# Patient Record
Sex: Female | Born: 1975 | Race: Black or African American | Hispanic: No | Marital: Married | Smoking: Never smoker
Health system: Southern US, Community
[De-identification: ages and names within clinical notes are randomized; demographics above are authoritative.]

## PROBLEM LIST (undated history)

## (undated) DIAGNOSIS — Z9889 Other specified postprocedural states: Secondary | ICD-10-CM

## (undated) DIAGNOSIS — R112 Nausea with vomiting, unspecified: Secondary | ICD-10-CM

## (undated) DIAGNOSIS — R0601 Orthopnea: Secondary | ICD-10-CM

## (undated) DIAGNOSIS — R0602 Shortness of breath: Secondary | ICD-10-CM

## (undated) DIAGNOSIS — J189 Pneumonia, unspecified organism: Secondary | ICD-10-CM

## (undated) HISTORY — DX: Shortness of breath: R06.02

## (undated) HISTORY — PX: FOOT SURGERY: SHX648

## (undated) HISTORY — DX: Orthopnea: R06.01

## (undated) HISTORY — DX: Pneumonia, unspecified organism: J18.9

---

## 2005-12-03 ENCOUNTER — Inpatient Hospital Stay (HOSPITAL_COMMUNITY): Admission: AD | Admit: 2005-12-03 | Discharge: 2005-12-03 | Payer: Self-pay | Admitting: Obstetrics and Gynecology

## 2005-12-04 ENCOUNTER — Inpatient Hospital Stay (HOSPITAL_COMMUNITY): Admission: AD | Admit: 2005-12-04 | Discharge: 2005-12-04 | Payer: Self-pay | Admitting: Obstetrics and Gynecology

## 2006-03-08 ENCOUNTER — Inpatient Hospital Stay (HOSPITAL_COMMUNITY): Admission: AD | Admit: 2006-03-08 | Discharge: 2006-03-11 | Payer: Self-pay | Admitting: Obstetrics and Gynecology

## 2006-03-09 ENCOUNTER — Encounter (INDEPENDENT_AMBULATORY_CARE_PROVIDER_SITE_OTHER): Payer: Self-pay | Admitting: *Deleted

## 2008-12-19 ENCOUNTER — Encounter: Payer: Self-pay | Admitting: Cardiovascular Disease

## 2009-03-21 ENCOUNTER — Inpatient Hospital Stay (HOSPITAL_COMMUNITY): Admission: AD | Admit: 2009-03-21 | Discharge: 2009-03-23 | Payer: Self-pay | Admitting: Obstetrics and Gynecology

## 2010-06-29 LAB — CBC
HCT: 35 % — ABNORMAL LOW (ref 36.0–46.0)
MCHC: 33.1 g/dL (ref 30.0–36.0)
MCHC: 33.8 g/dL (ref 30.0–36.0)
Platelets: 173 10*3/uL (ref 150–400)
Platelets: 192 10*3/uL (ref 150–400)
RBC: 3.56 MIL/uL — ABNORMAL LOW (ref 3.87–5.11)
RDW: 13.5 % (ref 11.5–15.5)

## 2010-08-14 NOTE — H&P (Signed)
NAME:  Janet Snyder, Janet Snyder NO.:  [id number]   MEDICAL RECORD NO.:  [id number]          PATIENT TYPE:  INP   LOCATION:  9162                          FACILITY:  [facility]   PHYSICIAN:  Osborn Coho, M.D.   DATE OF BIRTH:  Jul 25, 1975   DATE OF ADMISSION:  03/08/2006  DATE OF DISCHARGE:                              HISTORY & PHYSICAL   Janet Snyder is a 35 year old gravida 1, para 0, at 72 and 6/7 weeks, EDD  March 09, 2006, who presents in early active labor with contractions  increasing in frequency and intensity throughout the evening. She  reports positive fetal movement.  No vaginal bleeding.  No rupture of  membranes.  She denies any headache, visual changes or epigastric pain.  Her pregnancy has been followed by the MD service at [facility] and is  remarkable for:  1. History of abnormal Pap smear.  2. Family history of polydactyly.  3. First trimester bleeding.  4. Group B Strep negative.   This patient entered prenatal care at the office of [facility] on Aug 11, 2005, EDD determined by seven week ultrasound and confirmed with follow-  up.  This pregnancy has been essentially unremarkable.  At her prenatal  visit at 14 weeks, it was noted that she had an endocervical polyp.  This was removed by Dr. Stefano Gaul at [redacted] weeks gestation. At 26-1/2 weeks,  the patient had some preterm contractions.  Fetal fibronectin at that  time was negative.  She has remained size equal to dates throughout,  normotensive with no proteinuria. Initial pregnancy weight 160, last  recorded pregnancy weight 208 pounds.  Prenatal lab work on July 22, 2005, hemoglobin and hematocrit 11.6 and 34.5, platelets 205,000.  Blood  type and Rh B+, antibody screen negative.  Sickle cell trait negative,  VDRL nonreactive, rubella immune, hepatitis B surface antigen negative,  HIV nonreactive, GC and chlamydia negative.  CF testing negative.  Quad  screen declined. At 28 weeks, one hour glucose challenge 89 and  at 36  weeks, culture of the vaginal tract is negative for group B Strep.   OB HISTORY:  The current pregnancy.   MEDICAL HISTORY:  Significant for abnormal Pap, cervical polyp.  The  patient had foot surgery in 2003.   FAMILY HISTORY:  Maternal grandmother and maternal grandfather with a  history of chronic hypertension.  Maternal grandmother with varicose  veins. Maternal grandmother and paternal grandmother stroke.  Maternal  grandmother lupus. Paternal aunt cervical cancer.   GENETIC HISTORY:  The patient's father side of the family with a history  of polydactyly, otherwise, unremarkable.   ALLERGIES:  SULFA drugs.   HABITS:  The patient denies the use of tobacco, alcohol or illicit  drugs.   CURRENT MEDICATIONS:  Prenatal vitamins one p.o. daily   SOCIAL HISTORY:  Janet Snyder is a married African American female.  Her  husband, Janet Snyder, is involved and supportive.  They are [place]  in their faith.   REVIEW OF SYSTEMS:  As described above.  The patient is typical of one  with an intrauterine pregnancy  at term in early active labor.   PHYSICAL EXAMINATION:  VITAL SIGNS:  Stable, afebrile.  HEENT:  Unremarkable.  HEART:  Regular rate and rhythm.  LUNGS:  Clear.  ABDOMEN:  Gravid in its contour.  Uterine fundus is noted to extend 40  cm above the level of the pubic symphysis.  Leopold's maneuvers finds  the infant to be in a longitudinal lie, cephalic presentation, and the  estimated fetal weight is 7-1/2 pounds. Baseline of fetal heart rate  monitor is 140s with average long term variability, reactivity is  currently present with no periodic changes.  The patient is contracting  every 2-3 minutes.  EXTREMITIES:  No pathologic edema.  DTRs are 1+ with no clonus.  There  is no calf tenderness noted bilaterally.   ASSESSMENT:  Intrauterine pregnancy at term, early active labor.   PLAN:  Admit per Dr. Osborn Coho routine MD orders.  May have  epidural p.r.n.  for pain.  Anticipate spontaneous vaginal delivery.      Rica Koyanagi, C.N.M.      Osborn Coho, M.D.  Electronically Signed    SDM/MEDQ  D:  03/08/2006  T:  03/08/2006  Job:  [id number]

## 2010-08-19 ENCOUNTER — Inpatient Hospital Stay (HOSPITAL_COMMUNITY): Payer: Managed Care, Other (non HMO)

## 2010-08-19 ENCOUNTER — Inpatient Hospital Stay (HOSPITAL_COMMUNITY)
Admission: AD | Admit: 2010-08-19 | Discharge: 2010-08-23 | DRG: 781 | Disposition: A | Discharge status: Home / Self Care | Payer: Managed Care, Other (non HMO) | Source: Ambulatory Visit | Attending: Obstetrics and Gynecology | Admitting: Obstetrics and Gynecology

## 2010-08-19 ENCOUNTER — Inpatient Hospital Stay (HOSPITAL_COMMUNITY)
Admission: AD | Admit: 2010-08-19 | Payer: Managed Care, Other (non HMO) | Source: Ambulatory Visit | Admitting: Obstetrics and Gynecology

## 2010-08-19 DIAGNOSIS — O4700 False labor before 37 completed weeks of gestation, unspecified trimester: Secondary | ICD-10-CM | POA: Diagnosis present

## 2010-08-19 DIAGNOSIS — O26853 Spotting complicating pregnancy, third trimester: Secondary | ICD-10-CM

## 2010-08-19 DIAGNOSIS — D649 Anemia, unspecified: Secondary | ICD-10-CM | POA: Diagnosis present

## 2010-08-19 DIAGNOSIS — O4410 Placenta previa with hemorrhage, unspecified trimester: Principal | ICD-10-CM | POA: Diagnosis present

## 2010-08-19 DIAGNOSIS — O99019 Anemia complicating pregnancy, unspecified trimester: Secondary | ICD-10-CM | POA: Diagnosis present

## 2010-08-19 LAB — URINALYSIS, ROUTINE W REFLEX MICROSCOPIC
Glucose, UA: NEGATIVE mg/dL
Leukocytes, UA: NEGATIVE
Nitrite: NEGATIVE
Protein, ur: NEGATIVE mg/dL
Urobilinogen, UA: 0.2 mg/dL (ref 0.0–1.0)

## 2010-08-19 LAB — WET PREP, GENITAL: Yeast Wet Prep HPF POC: NONE SEEN

## 2010-08-19 LAB — URINE MICROSCOPIC-ADD ON

## 2010-08-20 LAB — CBC
HCT: 27.9 % — ABNORMAL LOW (ref 36.0–46.0)
Hemoglobin: 9 g/dL — ABNORMAL LOW (ref 12.0–15.0)
MCV: 87.5 fL (ref 78.0–100.0)
RBC: 3.19 MIL/uL — ABNORMAL LOW (ref 3.87–5.11)
WBC: 10.4 10*3/uL (ref 4.0–10.5)

## 2010-08-20 LAB — GC/CHLAMYDIA PROBE AMP, GENITAL

## 2010-08-23 ENCOUNTER — Encounter (HOSPITAL_COMMUNITY): Payer: Self-pay | Admitting: *Deleted

## 2010-09-05 ENCOUNTER — Inpatient Hospital Stay (HOSPITAL_COMMUNITY)
Admission: AD | Admit: 2010-09-05 | Discharge: 2010-09-05 | Disposition: A | Discharge status: Home / Self Care | Payer: Managed Care, Other (non HMO) | Source: Ambulatory Visit | Attending: Obstetrics and Gynecology | Admitting: Obstetrics and Gynecology

## 2010-09-05 DIAGNOSIS — O4700 False labor before 37 completed weeks of gestation, unspecified trimester: Secondary | ICD-10-CM | POA: Insufficient documentation

## 2010-09-05 LAB — URINALYSIS, ROUTINE W REFLEX MICROSCOPIC
Glucose, UA: NEGATIVE mg/dL
Nitrite: NEGATIVE
Specific Gravity, Urine: <1.005 — ABNORMAL LOW (ref 1.005–1.030)
pH: 6 (ref 5.0–8.0)

## 2010-09-05 LAB — URINE MICROSCOPIC-ADD ON

## 2010-09-07 ENCOUNTER — Other Ambulatory Visit (HOSPITAL_COMMUNITY): Payer: Self-pay | Admitting: Obstetrics and Gynecology

## 2010-09-07 DIAGNOSIS — O09529 Supervision of elderly multigravida, unspecified trimester: Secondary | ICD-10-CM

## 2010-09-09 ENCOUNTER — Ambulatory Visit (HOSPITAL_COMMUNITY)
Admission: RE | Admit: 2010-09-09 | Discharge: 2010-09-09 | Disposition: A | Discharge status: Home / Self Care | Payer: Managed Care, Other (non HMO) | Source: Ambulatory Visit | Attending: Obstetrics and Gynecology | Admitting: Obstetrics and Gynecology

## 2010-09-09 ENCOUNTER — Other Ambulatory Visit (HOSPITAL_COMMUNITY): Payer: Self-pay | Admitting: Obstetrics and Gynecology

## 2010-09-09 DIAGNOSIS — O09529 Supervision of elderly multigravida, unspecified trimester: Secondary | ICD-10-CM

## 2010-09-09 DIAGNOSIS — Z3689 Encounter for other specified antenatal screening: Secondary | ICD-10-CM | POA: Insufficient documentation

## 2010-09-09 DIAGNOSIS — O4400 Placenta previa specified as without hemorrhage, unspecified trimester: Secondary | ICD-10-CM | POA: Insufficient documentation

## 2010-09-15 ENCOUNTER — Inpatient Hospital Stay (HOSPITAL_COMMUNITY)
Admission: AD | Admit: 2010-09-15 | Discharge: 2010-09-17 | DRG: 766 | Disposition: A | Discharge status: Home / Self Care | Payer: Managed Care, Other (non HMO) | Source: Ambulatory Visit | Attending: Obstetrics and Gynecology | Admitting: Obstetrics and Gynecology

## 2010-09-15 ENCOUNTER — Other Ambulatory Visit (HOSPITAL_COMMUNITY): Payer: Managed Care, Other (non HMO)

## 2010-09-15 ENCOUNTER — Other Ambulatory Visit: Payer: Self-pay | Admitting: Obstetrics and Gynecology

## 2010-09-15 DIAGNOSIS — O4290 Premature rupture of membranes, unspecified as to length of time between rupture and onset of labor, unspecified weeks of gestation: Secondary | ICD-10-CM | POA: Diagnosis present

## 2010-09-15 DIAGNOSIS — O4410 Placenta previa with hemorrhage, unspecified trimester: Principal | ICD-10-CM | POA: Diagnosis present

## 2010-09-15 DIAGNOSIS — D4959 Neoplasm of unspecified behavior of other genitourinary organ: Secondary | ICD-10-CM | POA: Diagnosis present

## 2010-09-15 DIAGNOSIS — O34599 Maternal care for other abnormalities of gravid uterus, unspecified trimester: Secondary | ICD-10-CM | POA: Diagnosis present

## 2010-09-15 DIAGNOSIS — D259 Leiomyoma of uterus, unspecified: Secondary | ICD-10-CM | POA: Diagnosis present

## 2010-09-15 LAB — TYPE AND SCREEN: Antibody Screen: NEGATIVE

## 2010-09-15 LAB — CBC
MCHC: 32 g/dL (ref 30.0–36.0)
Platelets: 183 10*3/uL (ref 150–400)
RDW: 13.8 % (ref 11.5–15.5)
WBC: 9.8 10*3/uL (ref 4.0–10.5)

## 2010-09-16 LAB — CBC
HCT: 27 % — ABNORMAL LOW (ref 36.0–46.0)
MCH: 28.2 pg (ref 26.0–34.0)
MCV: 86.5 fL (ref 78.0–100.0)
Platelets: 163 10*3/uL (ref 150–400)
RBC: 3.12 MIL/uL — ABNORMAL LOW (ref 3.87–5.11)
RDW: 14 % (ref 11.5–15.5)
WBC: 9.3 10*3/uL (ref 4.0–10.5)

## 2010-09-22 ENCOUNTER — Inpatient Hospital Stay (HOSPITAL_COMMUNITY)
Admission: EM | Admit: 2010-09-22 | Payer: Managed Care, Other (non HMO) | Source: Ambulatory Visit | Admitting: Obstetrics and Gynecology

## 2010-09-22 NOTE — Op Note (Signed)
NAMEMarland Kitchen  Janet, Snyder NO.:  [id number]  MEDICAL RECORD NO.:  [id number]  LOCATION:  9109                          FACILITY:  [facility]  PHYSICIAN:  Janine Limbo, M.D.DATE OF BIRTH:  10/06/1975  DATE OF PROCEDURE:  09/15/2010 DATE OF DISCHARGE:                              OPERATIVE REPORT   PREOPERATIVE DIAGNOSES: 1. 36-week and 5-day gestation. 2. Placenta previa. 3. Premature and preterm rupture of membranes. 4. Fibroid uterus.  POSTOPERATIVE DIAGNOSES: 1. 36-week and 5-day gestation. 2. Placenta previa. 3. Premature and preterm rupture of membranes. 4. Fibroid uterus. 5. Two nuchal cords.  PROCEDURE:  Primary low-transverse cesarean section.  SURGEON:  Janine Limbo, MD  FIRST ASSISTANT:  Renaldo Reel. Emilee Hero, certified nurse midwife.  ANESTHETIC:  Spinal.  DISPOSITION:  Janet Snyder is a 35 year old female, gravida 3, para 2-0-0- 2, who presents at 36 weeks and 5-day gestation.  She has been followed at the [facility].  She reports rupture of membranes this morning. She has a known placenta previa and is scheduled for a cesarean delivery.  She denies bleeding at this point.  She is having only mild contractions.  The fetal heart rate tracing is listed as a category one. The patient understands the indications for her immediate cesarean delivery and she accepts the risks of, but not limited to, anesthetic complications, bleeding, infections, and possible damage to the surrounding organs.  The patient does not desire a tubal sterilization procedure.  FINDINGS:  An 8-pound female infant (name currently not known) was delivered from a cephalic presentation.  The Apgar scores were 8 at one minute and 9 at 5 minutes.  The patient was noted to have a 7-cm fibroid on the left anterior fundus of the uterus.  She was noted to have a placenta previa.  The fallopian tubes and the  ovaries were normal for the gravid state.  PROCEDURE:  The patient was taken to the operating room where a spinal anesthetic was given.  The patient's abdomen was prepped with ChloraPrep and the perineum and vagina were prepped with Betadine.  Foley catheter was placed in the bladder.  The patient was sterilely draped.  The lower abdomen was injected with 10 mL of 0.5% Marcaine with epinephrine.  A low-transverse incision was made in the abdomen and the incision was carried sharply through the subcutaneous tissue, the fascia, and the anterior peritoneum.  An incision was made in the lower uterine segment and extended in a low-transverse fashion.  The fetal head was delivered without difficulty.  The mouth and nose were suctioned.  Two nuchal cords were reduced.  The remainder of the infant was then delivered. The cord was clamped and cut and the infant was handed to the awaiting pediatric team.  Routine cord blood studies were obtained.  The placenta was removed without difficulty.  Hemostasis was adequate.  The uterine incision was closed using a running locking suture of 2-0 Vicryl followed by an imbricating suture of 2-0 Vicryl.  Figure-of-eight sutures of 2-0 Vicryl were used for hemostasis.  The pelvis was vigorously irrigated.  Again hemostasis was confirmed.  The anterior  peritoneum and abdominal musculature were reapproximated in the midline using 3-0 Vicryl.  The fascia was closed using a running suture of 0- Vicryl followed by three interrupted sutures of 0-Vicryl.  The subcutaneous layer was closed using interrupted sutures of 2-0 Vicryl. The skin was reapproximated using a subcuticular suture of 3-0 Monocryl. Sponge, needle, and instrument counts were correct on 2 occasions.  The estimated blood loss for the procedure was 850 mL.  The patient tolerated her procedure well.  She was transported to the recovery room in stable condition.  The infant was taken to the full-term  nursery in stable condition.  The placenta was sent to pathology for evaluation.     Janine Limbo, M.D.     AVS/MEDQ  D:  09/15/2010  T:  09/16/2010  Job:  [id number]  Electronically Signed by Kirkland Hun M.D. on 09/22/2010 07:26:18 AM

## 2010-10-16 NOTE — Discharge Summary (Signed)
NAMEMarland Kitchen  Janet Snyder, Janet Snyder NO.:  [id number]  MEDICAL RECORD NO.:  [id number]  LOCATION:  9109                          [facility]ILITY[facility]  [facility]  PHYSICIAN:  Osborn Coho, M.D.   DATE OF BIRTH:  1975-08-17  DATE OF ADMISSION:  09/15/2010 DATE OF DISCHARGE:  09/17/2010                              DISCHARGE SUMMARY   ADMITTING DIAGNOSES: 1. Intrauterine pregnancy at 36-5/7 weeks. 2. Spontaneous rupture of membranes. 3. Previa. 4. Irregular contractions.  DISCHARGE DIAGNOSES: 1. Intrauterine pregnancy at 36-5/7 weeks. 2. Placenta previa. 3. Premature and preterm rupture of membranes. 4. Fibroid uterus. 5. Two nuchal cords.  PROCEDURES: 1. Primary low transverse cesarean section. 2. Spinal anesthesia.  HOSPITAL COURSE:  Janet Snyder is a 35 year old, gravida 3, para 2-0-0-2, at 36-5/7 weeks who presented on the morning of September 15, 2010, with rupture of membranes at 6:30 a.m.  She had passed 1 clot prior to arrival.  She was leaking clear fluid, was having no active bleeding. She was noted to have a persistent previa that has been documented on third trimester ultrasound.  Fetal heart rate was reassuring.  Fluid was noted in the vaginal vault, with a positive fern, and the decision was made to proceed with cesarean section.  Her pregnancy had been remarkable for: 1. Persistent placenta previa. 2. Fibroid uterus on the left lateral side, approximately 5.6 cm on     last ultrasound. 3. 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  No recent or current     outbreak.  The patient was taken to the operating room by Dr. Stefano Gaul, who performed a primary low transverse cesarean section for a female infant, weight 8 pounds, Apgars were 8 and 9.  Infant was taken to the Full-Term Nursery.  Mother was taken to recovery in good condition.  The patient was noted to have a 7-cm fibroid on the left anterior fundus and the placenta previa was noted.  By postop day #1, the patient is  doing well.  She was up ad lib. Hemoglobin was 8.8, down from 10.3.  White blood cell count was 9.3. She was placed on iron.  Her physical exam was within normal limits. She was up ad lib.  She did report that her husband was now 2 months postop from vasectomy.  On the following morning, postop day #2, the patient was continuing to do well.  She advised she did want to go home. The baby was okayed for discharge.  The patient's incision was clean, dry, and intact.  The breastfeeding was going well.  She was deemed to have received the full benefit of her hospital stay and was discharged home in stable condition.  DISCHARGE INSTRUCTIONS:  Per [facility] handout.  DISCHARGE MEDICATIONS: 1. Motrin 600 mg p.o. q.6 h. p.r.n. pain. 2. Percocet 5/325 one to two p.o. q.3-4 h. p.r.n. pain. 3. The patient will take an over-the-counter iron supplement 1-2 p.o.     daily.  Discharge followup will occur in 6 weeks at [facility].     Renaldo Reel Janet Snyder, C.N.M.   ______________________________ Osborn Coho, M.D.    VLL/MEDQ  D:  09/17/2010  T:  09/18/2010  Job:  [id number]  Electronically Signed by Janet Snyder C.N.M. on 09/23/2010 08:10:00 PM Electronically Signed by Osborn Coho M.D. on 10/16/2010 05:32:35 PM

## 2010-10-19 NOTE — Discharge Summary (Signed)
NAMEMarland Kitchen  Janet Snyder, Janet Snyder NO.:  [id number]  MEDICAL RECORD NO.:  [id number]  LOCATION:  9159                          [facility]  [facility]  PHYSICIAN:  Naima A. Dillard, M.D. DATE OF BIRTH:  1975/05/01  DATE OF ADMISSION:  08/19/2010 DATE OF DISCHARGE:  08/23/2010                              DISCHARGE SUMMARY   REASON FOR ADMISSION:  Vaginal bleeding.  The patient is 32 weeks and 4 days.  She is a gravida 3, para 2.  Ultrasound showed a marginal placenta previa.  PRENATAL PROCEDURES:  None.  INTRAPARTUM PROCEDURES:  Ultrasound.  The patient was on bedrest.  She received betamethasone and ultrasound as well as fetal monitoring.  HOSPITAL COURSE:  The patient remained stable on bedrest.  Bleeding had stopped.  Fetal heart rate category I.  DISCHARGE DIAGNOSIS:  Third trimester bleeding, marginal placenta previa.  DISCHARGE INSTRUCTIONS:  For bedrest.  Call for bleeding, decreased fetal movement, contractions, or leakage of fluid.  MEDICATIONS: 1. Prenatal vitamin. 2. Iron 325 mg 1 tablet twice a day. 3. Stool softener 1-2 each day.  The patient was discharged home in stable condition with instructions to follow up per routine at [facility].    ______________________________ Sanda Klein, CNM   ______________________________ Pierre Bali. Normand Sloop, M.D.    SL/MEDQ  D:  10/18/2010  T:  10/19/2010  Job:  [id number]

## 2011-12-29 ENCOUNTER — Encounter: Payer: Self-pay | Admitting: Cardiovascular Disease

## 2012-01-18 ENCOUNTER — Encounter (HOSPITAL_BASED_OUTPATIENT_CLINIC_OR_DEPARTMENT_OTHER): Payer: Self-pay | Admitting: Emergency Medicine

## 2012-01-18 ENCOUNTER — Emergency Department (HOSPITAL_BASED_OUTPATIENT_CLINIC_OR_DEPARTMENT_OTHER)
Admission: EM | Admit: 2012-01-18 | Discharge: 2012-01-19 | Disposition: A | Discharge status: Home / Self Care | Payer: Managed Care, Other (non HMO) | Attending: Emergency Medicine | Admitting: Emergency Medicine

## 2012-01-18 DIAGNOSIS — R112 Nausea with vomiting, unspecified: Secondary | ICD-10-CM | POA: Insufficient documentation

## 2012-01-18 DIAGNOSIS — Z8701 Personal history of pneumonia (recurrent): Secondary | ICD-10-CM | POA: Insufficient documentation

## 2012-01-18 DIAGNOSIS — R197 Diarrhea, unspecified: Secondary | ICD-10-CM | POA: Insufficient documentation

## 2012-01-18 DIAGNOSIS — Z8709 Personal history of other diseases of the respiratory system: Secondary | ICD-10-CM | POA: Insufficient documentation

## 2012-01-18 DIAGNOSIS — Z79899 Other long term (current) drug therapy: Secondary | ICD-10-CM | POA: Insufficient documentation

## 2012-01-18 LAB — CBC WITH DIFFERENTIAL/PLATELET
Basophils Absolute: 0 10*3/uL (ref 0.0–0.1)
Eosinophils Relative: 0 % (ref 0–5)
HCT: 35.9 % — ABNORMAL LOW (ref 36.0–46.0)
Lymphocytes Relative: 11 % — ABNORMAL LOW (ref 12–46)
Lymphs Abs: 1.2 10*3/uL (ref 0.7–4.0)
MCV: 85.7 fL (ref 78.0–100.0)
Neutro Abs: 9.2 10*3/uL — ABNORMAL HIGH (ref 1.7–7.7)
Platelets: 146 10*3/uL — ABNORMAL LOW (ref 150–400)
RBC: 4.19 MIL/uL (ref 3.87–5.11)
RDW: 11.8 % (ref 11.5–15.5)
WBC: 10.7 10*3/uL — ABNORMAL HIGH (ref 4.0–10.5)

## 2012-01-18 LAB — URINALYSIS, ROUTINE W REFLEX MICROSCOPIC
Bilirubin Urine: NEGATIVE
Ketones, ur: NEGATIVE mg/dL
Nitrite: NEGATIVE
Protein, ur: NEGATIVE mg/dL
Specific Gravity, Urine: 1.009 (ref 1.005–1.030)
Urobilinogen, UA: 0.2 mg/dL (ref 0.0–1.0)

## 2012-01-18 LAB — PREGNANCY, URINE: Preg Test, Ur: NEGATIVE

## 2012-01-18 LAB — COMPREHENSIVE METABOLIC PANEL
ALT: 9 U/L (ref 0–35)
AST: 15 U/L (ref 0–37)
Alkaline Phosphatase: 52 U/L (ref 39–117)
CO2: 25 mEq/L (ref 19–32)
Chloride: 100 mEq/L (ref 96–112)
GFR calc Af Amer: >90 mL/min (ref >90)
GFR calc non Af Amer: >90 mL/min (ref >90)
Glucose, Bld: 120 mg/dL — ABNORMAL HIGH (ref 70–99)
Potassium: 3.5 mEq/L (ref 3.5–5.1)
Sodium: 136 mEq/L (ref 135–145)
Total Bilirubin: 0.3 mg/dL (ref 0.3–1.2)

## 2012-01-18 MED ORDER — ONDANSETRON HCL 4 MG/2ML IJ SOLN
4.0000 mg | Freq: Once | INTRAMUSCULAR | Status: AC
Start: 2012-01-19 — End: 2012-01-18
  Administered 2012-01-18: 4 mg via INTRAVENOUS

## 2012-01-18 MED ORDER — ONDANSETRON HCL 4 MG/2ML IJ SOLN
INTRAMUSCULAR | Status: AC
  Administered 2012-01-18: 4 mg via INTRAVENOUS
  Filled 2012-01-18: qty 2

## 2012-01-18 MED ORDER — SODIUM CHLORIDE 0.9 % IV BOLUS (SEPSIS)
1000.0000 mL | Freq: Once | INTRAVENOUS | Status: AC
  Administered 2012-01-18: 1000 mL via INTRAVENOUS

## 2012-01-18 NOTE — ED Notes (Signed)
MD at bedside. 

## 2012-01-18 NOTE — ED Notes (Signed)
Pt reports developing nausea/vomiting and diarhea and had bloody stool around 2000. Pt reports being in bathroom all day and does report history of hemrrhoids

## 2012-01-19 NOTE — ED Provider Notes (Signed)
History     CSN: [id number]  Arrival date & time 01/18/12  2151   First MD Initiated Contact with Patient 01/18/12 2322      Chief Complaint  Patient presents with  . Chills  . Diarrhea    (Consider location/radiation/quality/duration/timing/severity/associated sxs/prior treatment) Patient is a 36 y.o. female presenting with diarrhea. The history is provided by the patient. No language interpreter was used.  Diarrhea The primary symptoms include nausea and diarrhea. Primary symptoms do not include fever, weight loss, melena, jaundice, dysuria or myalgias. The illness began today. The onset was sudden. The problem has not changed since onset. Nausea began today. Associated with: nothing.  The diarrhea began today. The diarrhea is watery (scant blood on toilet paper has external hemorrohids). The diarrhea occurs 5 to 10 times per day. Risk factors: unknown.  The illness does not include chills, anorexia, dysphagia, constipation or tenesmus. Associated medical issues do not include inflammatory bowel disease.    Past Medical History  Diagnosis Date  . SOB (shortness of breath)   . PNA (pneumonia)   . Orthopnea     Past Surgical History  Procedure Date  . Foot surgery     Family History  Problem Relation Age of Onset  . Hypertension Father   . Heart failure Father     cva  . Heart attack Father     History  Substance Use Topics  . Smoking status: Never Smoker   . Smokeless tobacco: Not on file  . Alcohol Use: No    OB History    Grav Para Term Preterm Abortions TAB SAB Ect Mult Living   1               Review of Systems  Constitutional: Negative for fever, chills and weight loss.  Respiratory: Negative for shortness of breath.   Cardiovascular: Negative for chest pain.  Gastrointestinal: Positive for nausea and diarrhea. Negative for dysphagia, constipation, melena, anorexia and jaundice.  Genitourinary: Negative for dysuria and vaginal discharge.    Musculoskeletal: Negative for myalgias.  All other systems reviewed and are negative.    Allergies  Sulfonamide derivatives  Home Medications   Current Outpatient Rx  Name Route Sig Dispense Refill  . PRENATAL MULTIVITAMIN CH Oral Take 1 tablet by mouth daily.      BP 102/68  Pulse 70  Temp 98.3 F (36.8 C) (Oral)  Resp 16  Ht 5\' 7"  (1.702 m)  Wt 157 lb (71.215 kg)  BMI 24.59 kg/m2  SpO2 100%  LMP 01/17/2012  Breastfeeding? Unknown  Physical Exam  Constitutional: She is oriented to person, place, and time. She appears well-developed and well-nourished. No distress.  HENT:  Head: Normocephalic and atraumatic.  Mouth/Throat: Oropharynx is clear and moist.  Eyes: Conjunctivae normal are normal. Pupils are equal, round, and reactive to light.  Neck: Normal range of motion. Neck supple.  Cardiovascular: Normal rate and regular rhythm.   Pulmonary/Chest: Effort normal and breath sounds normal. She has no wheezes. She has no rales.  Abdominal: Soft. Bowel sounds are normal. She exhibits no distension and no mass. There is no tenderness. There is no rebound and no guarding.  Genitourinary:       Non thrombosed hemorrhoid with scant blood on it  Musculoskeletal: Normal range of motion. She exhibits no edema.  Neurological: She is alert and oriented to person, place, and time.  Skin: Skin is warm and dry.  Psychiatric: She has a normal mood and affect.  ED Course  Procedures (including critical care time)  Labs Reviewed  CBC WITH DIFFERENTIAL - Abnormal; Notable for the following:    WBC 10.7 (*)     Hemoglobin 11.9 (*)     HCT 35.9 (*)     Platelets 146 (*)     Neutrophils Relative 86 (*)     Neutro Abs 9.2 (*)     Lymphocytes Relative 11 (*)     All other components within normal limits  COMPREHENSIVE METABOLIC PANEL - Abnormal; Notable for the following:    Glucose, Bld 120 (*)     All other components within normal limits  URINALYSIS, ROUTINE W REFLEX  MICROSCOPIC - Abnormal; Notable for the following:    Hgb urine dipstick SMALL (*)     All other components within normal limits  URINE MICROSCOPIC-ADD ON - Abnormal; Notable for the following:    Bacteria, UA MANY (*)     Crystals CA OXALATE CRYSTALS (*)     All other components within normal limits  PREGNANCY, URINE   No results found.   No diagnosis found.    MDM  Exam labs and vital signs reassuring.  No indication for imaging at this time. Return for fevers, abdominal pain, worsening bleeding or any concerns.  Patient and husband verbalize understanding and agree to follow up        Tameyah Koch Smitty Cords, MD 01/19/12 1610

## 2012-03-27 ENCOUNTER — Telehealth: Payer: Self-pay | Admitting: Obstetrics and Gynecology

## 2012-03-27 NOTE — Telephone Encounter (Signed)
Lm on vm tcb rgd msg 

## 2012-03-28 NOTE — Telephone Encounter (Signed)
Lm on vm tcb rgd msg 

## 2012-03-28 NOTE — Telephone Encounter (Signed)
Spoke with pt rgd msg pt c/o irreg bleeding wants eval pt has appt 04/07/12 at 10:00 with nd pt voice undertanding

## 2012-04-07 ENCOUNTER — Encounter: Payer: Managed Care, Other (non HMO) | Admitting: Obstetrics and Gynecology

## 2012-05-05 ENCOUNTER — Ambulatory Visit: Payer: Managed Care, Other (non HMO) | Admitting: Obstetrics and Gynecology

## 2012-05-05 ENCOUNTER — Encounter: Payer: Self-pay | Admitting: Obstetrics and Gynecology

## 2012-05-05 VITALS — BP 110/70 | Ht 67.0 in | Wt 161.0 lb

## 2012-05-05 DIAGNOSIS — N644 Mastodynia: Secondary | ICD-10-CM

## 2012-05-05 DIAGNOSIS — N926 Irregular menstruation, unspecified: Secondary | ICD-10-CM

## 2012-05-05 DIAGNOSIS — B9689 Other specified bacterial agents as the cause of diseases classified elsewhere: Secondary | ICD-10-CM

## 2012-05-05 LAB — POCT URINE PREGNANCY: Preg Test, Ur: NEGATIVE

## 2012-05-05 NOTE — Progress Notes (Signed)
When did bleeding start: 04/28/12 How  Long: 1 week How often changing pad/tampon: every 3 hrs Bleeding Disorders: no Cramping: yes Contraception: no Fibroids: yes Hormone Therapy: no New Medications: no Menopausal Symptoms: no Vag. Discharge: yes Abdominal Pain: yes Increased Stress: no BP 110/70  Ht 5\' 7"  (1.702 m)  Wt 161 lb (73.029 kg)  BMI 25.22 kg/m2  LMP 04/28/2012  Breastfeeding? No Pt with multiple concerns 1. Pt with irregular vaginal bleeding.  She has a hx of fibroids.  Periods are q month lastin 10 days 2.  Vaginal discharge with a fishy odor 3. Right breast pain.  No masses or nipple dischrge. No pertinant family hitory Physical Examination: General appearance - alert, well appearing, and in no distress Chest - clear to auscultation, no wheezes, rales or rhonchi, symmetric air entry Heart - normal rate and regular rhythm Breasts - breasts appear normal, no suspicious masses, no skin or nipple changes or axillary nodes Pelvic - normal external genitalia, vulva, vagina, cervix,  and adnexa, WET MOUNT done - results: clue cells, excessive bacteria Uterus irregular and 11 wk size Results for orders placed in visit on 05/05/12  POCT URINE PREGNANCY      Component Value Range   Preg Test, Ur Negative    US/SHG @[place]  To breast center for [place] and dx mammogram Flagyl for BV

## 2012-05-17 ENCOUNTER — Ambulatory Visit
Admission: RE | Admit: 2012-05-17 | Discharge: 2012-05-17 | Disposition: A | Discharge status: Home / Self Care | Payer: Managed Care, Other (non HMO) | Source: Ambulatory Visit | Attending: Obstetrics and Gynecology | Admitting: Obstetrics and Gynecology

## 2012-05-17 DIAGNOSIS — N644 Mastodynia: Secondary | ICD-10-CM

## 2012-06-05 ENCOUNTER — Other Ambulatory Visit: Payer: Self-pay | Admitting: Obstetrics and Gynecology

## 2012-06-05 ENCOUNTER — Ambulatory Visit: Payer: Managed Care, Other (non HMO)

## 2012-06-05 ENCOUNTER — Ambulatory Visit: Payer: Managed Care, Other (non HMO) | Admitting: Obstetrics and Gynecology

## 2012-06-05 VITALS — BP 110/64 | Ht 67.0 in | Wt 161.0 lb

## 2012-06-05 DIAGNOSIS — N920 Excessive and frequent menstruation with regular cycle: Secondary | ICD-10-CM

## 2012-06-05 DIAGNOSIS — N926 Irregular menstruation, unspecified: Secondary | ICD-10-CM

## 2012-06-05 MED ORDER — METRONIDAZOLE 500 MG PO TABS: 500.0000 mg | ORAL_TABLET | Freq: Two times a day (BID) | ORAL | Status: DC

## 2012-06-05 NOTE — Progress Notes (Signed)
t here for [facility] and EMBX for irregular bleeding Uterus 6.86 cm by 6.07 cm 4.6 cm fibroid seen on left lateral uterine wall SHG/EMBX:  The patient was consented for both procedures.  She was placed in dorsal lithotomy position and speculum placed in the vagina.  The cervix was cleansed with three betadine swabs.  The endometrial pipet was placed in the the endometrial cavity through the cervix.  The uterus did sound to 8cm.  The pipet was removed and specimen was sent to pathology.  The sonohysterography catheter was then placed through the cervix and vaginal probe placed back in the vagina. No submucsal component was noted All treatments for irregular bleeding reviewed with the pt She will all with decision Flagyl rx printed off for BV Pt had [place] of breast.  US WNL

## 2012-06-07 LAB — PATHOLOGY

## 2012-06-13 ENCOUNTER — Emergency Department (HOSPITAL_BASED_OUTPATIENT_CLINIC_OR_DEPARTMENT_OTHER): Payer: Managed Care, Other (non HMO)

## 2012-06-13 ENCOUNTER — Observation Stay (HOSPITAL_COMMUNITY): Payer: Managed Care, Other (non HMO)

## 2012-06-13 ENCOUNTER — Encounter (HOSPITAL_COMMUNITY): Payer: Self-pay | Admitting: Anesthesiology

## 2012-06-13 ENCOUNTER — Encounter (HOSPITAL_COMMUNITY): Admission: EM | Disposition: A | Discharge status: Home / Self Care | Payer: Self-pay | Source: Home / Self Care

## 2012-06-13 ENCOUNTER — Observation Stay (HOSPITAL_COMMUNITY): Payer: Managed Care, Other (non HMO) | Admitting: Anesthesiology

## 2012-06-13 ENCOUNTER — Encounter (HOSPITAL_BASED_OUTPATIENT_CLINIC_OR_DEPARTMENT_OTHER): Payer: Self-pay | Admitting: *Deleted

## 2012-06-13 ENCOUNTER — Observation Stay (HOSPITAL_BASED_OUTPATIENT_CLINIC_OR_DEPARTMENT_OTHER)
Admission: EM | Admit: 2012-06-13 | Discharge: 2012-06-14 | Disposition: A | Discharge status: Home / Self Care | Payer: Managed Care, Other (non HMO) | Attending: General Surgery | Admitting: General Surgery

## 2012-06-13 DIAGNOSIS — K8010 Calculus of gallbladder with chronic cholecystitis without obstruction: Principal | ICD-10-CM | POA: Diagnosis present

## 2012-06-13 DIAGNOSIS — K8020 Calculus of gallbladder without cholecystitis without obstruction: Secondary | ICD-10-CM

## 2012-06-13 HISTORY — PX: CHOLECYSTECTOMY: SHX55

## 2012-06-13 LAB — SURGICAL PCR SCREEN: MRSA, PCR: NEGATIVE

## 2012-06-13 LAB — CBC WITH DIFFERENTIAL/PLATELET
Basophils Absolute: 0 10*3/uL (ref 0.0–0.1)
Basophils Relative: 0 % (ref 0–1)
Eosinophils Relative: 1 % (ref 0–5)
Lymphocytes Relative: 17 % (ref 12–46)
MCHC: 33.6 g/dL (ref 30.0–36.0)
MCV: 86 fL (ref 78.0–100.0)
Neutro Abs: 7.7 10*3/uL (ref 1.7–7.7)
Platelets: 134 10*3/uL — ABNORMAL LOW (ref 150–400)
RDW: 11.7 % (ref 11.5–15.5)
WBC: 9.7 10*3/uL (ref 4.0–10.5)

## 2012-06-13 LAB — URINALYSIS, ROUTINE W REFLEX MICROSCOPIC
Nitrite: NEGATIVE
Protein, ur: NEGATIVE mg/dL
Specific Gravity, Urine: 1.023 (ref 1.005–1.030)
Urobilinogen, UA: 0.2 mg/dL (ref 0.0–1.0)

## 2012-06-13 LAB — COMPREHENSIVE METABOLIC PANEL
ALT: 12 U/L (ref 0–35)
AST: 14 U/L (ref 0–37)
Albumin: 3.8 g/dL (ref 3.5–5.2)
CO2: 22 mEq/L (ref 19–32)
Calcium: 9.4 mg/dL (ref 8.4–10.5)
Creatinine, Ser: 0.8 mg/dL (ref 0.50–1.10)
GFR calc non Af Amer: >90 mL/min (ref >90)
Sodium: 137 mEq/L (ref 135–145)
Total Protein: 7.4 g/dL (ref 6.0–8.3)

## 2012-06-13 LAB — URINE MICROSCOPIC-ADD ON: RBC / HPF: NONE SEEN RBC/hpf (ref <3)

## 2012-06-13 LAB — PREGNANCY, URINE: Preg Test, Ur: NEGATIVE

## 2012-06-13 SURGERY — LAPAROSCOPIC CHOLECYSTECTOMY WITH INTRAOPERATIVE CHOLANGIOGRAM
Anesthesia: General | Wound class: Clean Contaminated

## 2012-06-13 MED ORDER — ACETAMINOPHEN 325 MG PO TABS: 650.0000 mg | ORAL_TABLET | ORAL | Status: DC | PRN

## 2012-06-13 MED ORDER — MIDAZOLAM HCL 5 MG/5ML IJ SOLN
INTRAMUSCULAR | Status: DC | PRN
  Administered 2012-06-13: 2 mg via INTRAVENOUS

## 2012-06-13 MED ORDER — ONDANSETRON HCL 4 MG/2ML IJ SOLN
INTRAMUSCULAR | Status: DC | PRN
  Administered 2012-06-13: 4 mg via INTRAVENOUS

## 2012-06-13 MED ORDER — LIDOCAINE HCL (CARDIAC) 20 MG/ML IV SOLN
INTRAVENOUS | Status: DC | PRN
  Administered 2012-06-13: 100 mg via INTRAVENOUS

## 2012-06-13 MED ORDER — KCL IN DEXTROSE-NACL 20-5-0.45 MEQ/L-%-% IV SOLN
INTRAVENOUS | Status: DC
  Administered 2012-06-13: 18:00:00 via INTRAVENOUS
  Filled 2012-06-13 (×2): qty 1000

## 2012-06-13 MED ORDER — SUCCINYLCHOLINE CHLORIDE 20 MG/ML IJ SOLN
INTRAMUSCULAR | Status: DC | PRN
  Administered 2012-06-13: 100 mg via INTRAVENOUS

## 2012-06-13 MED ORDER — SODIUM CHLORIDE 0.9 % IV BOLUS (SEPSIS)
1000.0000 mL | Freq: Once | INTRAVENOUS | Status: AC
  Administered 2012-06-13: 1000 mL via INTRAVENOUS

## 2012-06-13 MED ORDER — CIPROFLOXACIN IN D5W 400 MG/200ML IV SOLN
400.0000 mg | Freq: Two times a day (BID) | INTRAVENOUS | Status: DC
  Administered 2012-06-13: 400 mg via INTRAVENOUS
  Filled 2012-06-13: qty 200

## 2012-06-13 MED ORDER — HYDROMORPHONE HCL PF 1 MG/ML IJ SOLN: 0.5000 mg | INTRAMUSCULAR | Status: DC | PRN

## 2012-06-13 MED ORDER — FENTANYL CITRATE 0.05 MG/ML IJ SOLN
INTRAMUSCULAR | Status: DC | PRN
  Administered 2012-06-13: 50 ug via INTRAVENOUS

## 2012-06-13 MED ORDER — HYDROMORPHONE HCL PF 1 MG/ML IJ SOLN
1.0000 mg | Freq: Once | INTRAMUSCULAR | Status: AC
  Administered 2012-06-13: 1 mg via INTRAVENOUS
  Filled 2012-06-13: qty 1

## 2012-06-13 MED ORDER — FENTANYL CITRATE 0.05 MG/ML IJ SOLN: 25.0000 ug | INTRAMUSCULAR | Status: DC | PRN

## 2012-06-13 MED ORDER — DEXAMETHASONE SODIUM PHOSPHATE 10 MG/ML IJ SOLN
INTRAMUSCULAR | Status: DC | PRN
  Administered 2012-06-13: 10 mg via INTRAVENOUS

## 2012-06-13 MED ORDER — LACTATED RINGERS IV SOLN
INTRAVENOUS | Status: DC | PRN
  Administered 2012-06-13 (×2): via INTRAVENOUS

## 2012-06-13 MED ORDER — LACTATED RINGERS IV SOLN
INTRAVENOUS | Status: DC | PRN
  Administered 2012-06-13: 1000 mL

## 2012-06-13 MED ORDER — GLYCOPYRROLATE 0.2 MG/ML IJ SOLN
INTRAMUSCULAR | Status: DC | PRN
  Administered 2012-06-13: 0.6 mg via INTRAVENOUS

## 2012-06-13 MED ORDER — ACETAMINOPHEN 325 MG PO TABS: 650.0000 mg | ORAL_TABLET | Freq: Four times a day (QID) | ORAL | Status: DC | PRN

## 2012-06-13 MED ORDER — POTASSIUM CHLORIDE IN NACL 20-0.9 MEQ/L-% IV SOLN
INTRAVENOUS | Status: DC
  Filled 2012-06-13 (×2): qty 1000

## 2012-06-13 MED ORDER — HYDROMORPHONE HCL PF 1 MG/ML IJ SOLN
1.0000 mg | INTRAMUSCULAR | Status: DC | PRN
  Filled 2012-06-13: qty 1

## 2012-06-13 MED ORDER — 0.9 % SODIUM CHLORIDE (POUR BTL) OPTIME
TOPICAL | Status: DC | PRN
  Administered 2012-06-13: 1000 mL

## 2012-06-13 MED ORDER — PROPOFOL 10 MG/ML IV BOLUS
INTRAVENOUS | Status: DC | PRN
  Administered 2012-06-13: 160 mg via INTRAVENOUS

## 2012-06-13 MED ORDER — ONDANSETRON HCL 4 MG/2ML IJ SOLN: 4.0000 mg | Freq: Four times a day (QID) | INTRAMUSCULAR | Status: DC | PRN

## 2012-06-13 MED ORDER — PROMETHAZINE HCL 25 MG/ML IJ SOLN: 12.5000 mg | INTRAMUSCULAR | Status: DC | PRN

## 2012-06-13 MED ORDER — PANTOPRAZOLE SODIUM 40 MG IV SOLR
40.0000 mg | Freq: Once | INTRAVENOUS | Status: AC
  Administered 2012-06-13: 40 mg via INTRAVENOUS
  Filled 2012-06-13: qty 40

## 2012-06-13 MED ORDER — SODIUM CHLORIDE 0.9 % IV SOLN: INTRAVENOUS | Status: DC

## 2012-06-13 MED ORDER — IOHEXOL 300 MG/ML  SOLN
INTRAMUSCULAR | Status: DC | PRN
  Administered 2012-06-13: 10 mL

## 2012-06-13 MED ORDER — ACETAMINOPHEN 650 MG RE SUPP: 650.0000 mg | Freq: Four times a day (QID) | RECTAL | Status: DC | PRN

## 2012-06-13 MED ORDER — HYDROMORPHONE HCL PF 1 MG/ML IJ SOLN
INTRAMUSCULAR | Status: AC
  Administered 2012-06-13: 1 mg
  Filled 2012-06-13: qty 1

## 2012-06-13 MED ORDER — ONDANSETRON HCL 4 MG/2ML IJ SOLN
4.0000 mg | Freq: Once | INTRAMUSCULAR | Status: AC
  Administered 2012-06-13: 4 mg via INTRAVENOUS
  Filled 2012-06-13: qty 2

## 2012-06-13 MED ORDER — ROCURONIUM BROMIDE 100 MG/10ML IV SOLN
INTRAVENOUS | Status: DC | PRN
  Administered 2012-06-13: 25 mg via INTRAVENOUS

## 2012-06-13 MED ORDER — NEOSTIGMINE METHYLSULFATE 1 MG/ML IJ SOLN
INTRAMUSCULAR | Status: DC | PRN
  Administered 2012-06-13: 4 mg via INTRAVENOUS

## 2012-06-13 MED ORDER — HYDROCODONE-ACETAMINOPHEN 5-325 MG PO TABS
1.0000 | ORAL_TABLET | ORAL | Status: DC | PRN
  Administered 2012-06-14: 2 via ORAL
  Filled 2012-06-13: qty 2

## 2012-06-13 MED ORDER — BUPIVACAINE-EPINEPHRINE 0.5% -1:200000 IJ SOLN
INTRAMUSCULAR | Status: DC | PRN
  Administered 2012-06-13: 30 mL

## 2012-06-13 MED ORDER — LACTATED RINGERS IV SOLN: INTRAVENOUS | Status: DC

## 2012-06-13 MED ORDER — CHLORHEXIDINE GLUCONATE CLOTH 2 % EX PADS
6.0000 | MEDICATED_PAD | Freq: Every day | CUTANEOUS | Status: DC
  Administered 2012-06-13: 6 via TOPICAL

## 2012-06-13 SURGICAL SUPPLY — 42 items
APL SKNCLS STERI-STRIP NONHPOA (GAUZE/BANDAGES/DRESSINGS) ×1
APPLIER CLIP ROT 10 11.4 M/L (STAPLE) ×2
APR CLP MED LRG 11.4X10 (STAPLE) ×1
BAG SPEC RTRVL LRG 6X4 10 (ENDOMECHANICALS) ×1
BENZOIN TINCTURE PRP APPL 2/3 (GAUZE/BANDAGES/DRESSINGS) ×2 IMPLANT
CABLE HIGH FREQUENCY MONO STRZ (ELECTRODE) ×2 IMPLANT
CANISTER SUCTION 2500CC (MISCELLANEOUS) ×2 IMPLANT
CHLORAPREP W/TINT 26ML (MISCELLANEOUS) ×2 IMPLANT
CLIP APPLIE ROT 10 11.4 M/L (STAPLE) ×1 IMPLANT
CLOTH BEACON ORANGE TIMEOUT ST (SAFETY) ×2 IMPLANT
COVER MAYO STAND STRL (DRAPES) ×2 IMPLANT
DECANTER SPIKE VIAL GLASS SM (MISCELLANEOUS) ×2 IMPLANT
DRAPE C-ARM 42X72 X-RAY (DRAPES) ×2 IMPLANT
DRAPE LAPAROSCOPIC ABDOMINAL (DRAPES) ×2 IMPLANT
DRAPE UTILITY 15X26 (DRAPE) ×2 IMPLANT
ELECT REM PT RETURN 9FT ADLT (ELECTROSURGICAL) ×2
ELECTRODE REM PT RTRN 9FT ADLT (ELECTROSURGICAL) ×1 IMPLANT
GAUZE SPONGE 2X2 8PLY STRL LF (GAUZE/BANDAGES/DRESSINGS) IMPLANT
GLOVE BIOGEL PI IND STRL 7.0 (GLOVE) ×1 IMPLANT
GLOVE BIOGEL PI INDICATOR 7.0 (GLOVE) ×1
GLOVE SURG ORTHO 8.0 STRL STRW (GLOVE) ×2 IMPLANT
GOWN STRL NON-REIN LRG LVL3 (GOWN DISPOSABLE) ×2 IMPLANT
GOWN STRL REIN XL XLG (GOWN DISPOSABLE) ×4 IMPLANT
HEMOSTAT SURGICEL 4X8 (HEMOSTASIS) IMPLANT
KIT BASIN OR (CUSTOM PROCEDURE TRAY) ×2 IMPLANT
NS IRRIG 1000ML POUR BTL (IV SOLUTION) ×2 IMPLANT
POUCH SPECIMEN RETRIEVAL 10MM (ENDOMECHANICALS) ×2 IMPLANT
SCISSORS LAP 5X35 DISP (ENDOMECHANICALS) ×1 IMPLANT
SET CHOLANGIOGRAPH MIX (MISCELLANEOUS) ×2 IMPLANT
SET IRRIG TUBING LAPAROSCOPIC (IRRIGATION / IRRIGATOR) ×2 IMPLANT
SLEEVE Z-THREAD 5X100MM (TROCAR) ×2 IMPLANT
SOLUTION ANTI FOG 6CC (MISCELLANEOUS) ×2 IMPLANT
SPONGE GAUZE 2X2 STER 10/PKG (GAUZE/BANDAGES/DRESSINGS) ×1
STRIP CLOSURE SKIN 1/2X4 (GAUZE/BANDAGES/DRESSINGS) ×2 IMPLANT
SUT MNCRL AB 4-0 PS2 18 (SUTURE) ×2 IMPLANT
TAPE CLOTH SURG 4X10 WHT LF (GAUZE/BANDAGES/DRESSINGS) ×1 IMPLANT
TOWEL OR 17X26 10 PK STRL BLUE (TOWEL DISPOSABLE) ×6 IMPLANT
TRAY LAP CHOLE (CUSTOM PROCEDURE TRAY) ×2 IMPLANT
TROCAR XCEL BLUNT TIP 100MML (ENDOMECHANICALS) ×2 IMPLANT
TROCAR Z-THREAD FIOS 11X100 BL (TROCAR) ×2 IMPLANT
TROCAR Z-THREAD FIOS 5X100MM (TROCAR) ×4 IMPLANT
TUBING INSUFFLATION 10FT LAP (TUBING) ×2 IMPLANT

## 2012-06-13 NOTE — Transfer of Care (Signed)
Immediate Anesthesia Transfer of Care Note  Patient: Janet Snyder  Procedure(s) Performed: Procedure(s): LAPAROSCOPIC CHOLECYSTECTOMY WITH INTRAOPERATIVE CHOLANGIOGRAM (N/A)  Patient Location: PACU  Anesthesia Type:General  Level of Consciousness: sedated  Airway & Oxygen Therapy: Patient Spontanous Breathing and Patient connected to face mask oxygen  Post-op Assessment: Report given to PACU RN and Post -op Vital signs reviewed and stable  Post vital signs: Reviewed and stable  Complications: No apparent anesthesia complications

## 2012-06-13 NOTE — H&P (Signed)
Janet Snyder is an 37 y.o. female.   Chief Complaint: Abdominal pain HPI: This is a normally healthy 37 year old woman who awoke with  abdominal pain this morning around 5 AM. She initially thought this was reflux symptoms, she treated it with Prilosec and Tums.  It did not help and she has never had this problem before.  She had her husband take her to the ER at [facility], where workup showed, essentially normal labs lipase of 25, normal WBC.  Abdominal ultrasound shows cholelithiasis with positive Murphy sign no gallbladder wall thickening pericholecystic fluid common bile duct was 4 mm there was no other abnormality on the exam. She was referred to [facility] and Dr.Gerkin for evaluation for symptomatic cholelithiasis/cholecystitis.  Past Medical History  Diagnosis Date  . SOB (shortness of breath)   . PNA (pneumonia)   . Orthopnea     Past Surgical History  Procedure Laterality Date  . Foot surgery      Family History  Problem Relation Age of Onset  . Hypertension Father   . Heart failure Father     cva  . Heart attack Father    Social History:  reports that she has never smoked. She has never used smokeless tobacco. She reports that she does not drink alcohol or use illicit drugs.  Allergies:  Allergies  Allergen Reactions  . Sulfonamide Derivatives     Medications Prior to Admission  Medication Sig Dispense Refill  . Multiple Vitamins-Minerals (MULTIVITAMIN WITH MINERALS) tablet Take 1 tablet by mouth daily.      . naproxen sodium (ANAPROX) 220 MG tablet Take 220 mg by mouth daily as needed.      . vitamin E 1000 UNIT capsule Take 1,000 Units by mouth daily.        Results for orders placed during the hospital encounter of 06/13/12 (from the past 48 hour(s))  URINALYSIS, ROUTINE W REFLEX MICROSCOPIC     Status: Abnormal   Collection Time    06/13/12  8:23 AM      Result Value Range   Color, Urine YELLOW  YELLOW   APPearance TURBID (*) CLEAR    Specific Gravity, Urine 1.023  1.005 - 1.030   pH 8.0  5.0 - 8.0   Glucose, UA NEGATIVE  NEGATIVE mg/dL   Hgb urine dipstick NEGATIVE  NEGATIVE   Bilirubin Urine NEGATIVE  NEGATIVE   Ketones, ur 15 (*) NEGATIVE mg/dL   Protein, ur NEGATIVE  NEGATIVE mg/dL   Urobilinogen, UA 0.2  0.0 - 1.0 mg/dL   Nitrite NEGATIVE  NEGATIVE   Leukocytes, UA NEGATIVE  NEGATIVE  PREGNANCY, URINE     Status: None   Collection Time    06/13/12  8:23 AM      Result Value Range   Preg Test, Ur NEGATIVE  NEGATIVE   Comment:            THE SENSITIVITY OF THIS     METHODOLOGY IS >20 mIU/mL.  URINE MICROSCOPIC-ADD ON     Status: Abnormal   Collection Time    06/13/12  8:23 AM      Result Value Range   Squamous Epithelial / LPF RARE  RARE   WBC, UA 0-2  <3 WBC/hpf   RBC / HPF    <3 RBC/hpf   Value: NO FORMED ELEMENTS SEEN ON URINE MICROSCOPIC EXAMINATION   Bacteria, UA FEW (*) RARE   Urine-Other AMORPHOUS URATES/PHOSPHATES    CBC WITH DIFFERENTIAL  Status: Abnormal   Collection Time    06/13/12  8:30 AM      Result Value Range   WBC 9.7  4.0 - 10.5 K/uL   RBC 4.22  3.87 - 5.11 MIL/uL   Hemoglobin 12.2  12.0 - 15.0 g/dL   HCT 96.0  45.4 - 09.8 %   MCV 86.0  78.0 - 100.0 fL   MCH 28.9  26.0 - 34.0 pg   MCHC 33.6  30.0 - 36.0 g/dL   RDW 11.9  14.7 - 82.9 %   Platelets 134 (*) 150 - 400 K/uL   Neutrophils Relative 79 (*) 43 - 77 %   Neutro Abs 7.7  1.7 - 7.7 K/uL   Lymphocytes Relative 17  12 - 46 %   Lymphs Abs 1.6  0.7 - 4.0 K/uL   Monocytes Relative 4  3 - 12 %   Monocytes Absolute 0.4  0.1 - 1.0 K/uL   Eosinophils Relative 1  0 - 5 %   Eosinophils Absolute 0.1  0.0 - 0.7 K/uL   Basophils Relative 0  0 - 1 %   Basophils Absolute 0.0  0.0 - 0.1 K/uL  COMPREHENSIVE METABOLIC PANEL     Status: Abnormal   Collection Time    06/13/12  8:30 AM      Result Value Range   Sodium 137  135 - 145 mEq/L   Potassium 3.4 (*) 3.5 - 5.1 mEq/L   Chloride 104  96 - 112 mEq/L   CO2 22  19 - 32 mEq/L    Glucose, Bld 111 (*) 70 - 99 mg/dL   BUN 13  6 - 23 mg/dL   Creatinine, Ser 5.62  0.50 - 1.10 mg/dL   Calcium 9.4  8.4 - 13.0 mg/dL   Total Protein 7.4  6.0 - 8.3 g/dL   Albumin 3.8  3.5 - 5.2 g/dL   AST 14  0 - 37 U/L   ALT 12  0 - 35 U/L   Alkaline Phosphatase 49  39 - 117 U/L   Total Bilirubin 0.3  0.3 - 1.2 mg/dL   GFR calc non Af Amer >90  >90 mL/min   GFR calc Af Amer >90  >90 mL/min   Comment:            The eGFR has been calculated     using the CKD EPI equation.     This calculation has not been     validated in all clinical     situations.     eGFR's persistently     <90 mL/min signify     possible Chronic Kidney Disease.  LIPASE, BLOOD     Status: None   Collection Time    06/13/12  8:30 AM      Result Value Range   Lipase 25  11 - 59 U/L   US Abdomen Complete  06/13/2012  *RADIOLOGY REPORT*  Clinical Data:  Right upper quadrant abdominal pain  COMPLETE ABDOMINAL ULTRASOUND  Comparison:  None.  Findings:  Gallbladder:  Multiple gallstones including a 1.5 cm stone in the gallbladder neck.  No gallbladder wall thickening or pericholecystic fluid.  Positive sonographic Murphy's sign.  Common bile duct:  Measures 4 mm.  Liver:  No focal lesion identified.  Within normal limits in parenchymal echogenicity.  IVC:  Poorly visualized due to overlying bowel gas.  Pancreas:  Poorly visualized due to overlying bowel gas.  Spleen:  Measures 7.6 cm.  Right Kidney:  Measures 11.7 cm.  No mass or hydronephrosis.  Left Kidney:  Measures 11.0 cm.  No mass or hydronephrosis.  Abdominal aorta:  No aneurysm identified.  IMPRESSION: Cholelithiasis with positive sonographic Murphy's sign.  No gallbladder wall thickening or pericholecystic fluid.  This appearance is equivocal but early acute cholecystitis is possible.  Consider hepatobiliary nuclear medicine scan for further evaluation.   Original Report Authenticated By: Charline Bills, M.D.     Review of Systems  Constitutional: Negative.    HENT: Negative.   Eyes: Negative.   Respiratory: Negative.   Cardiovascular: Negative.   Gastrointestinal: Positive for heartburn (occasional ), nausea and abdominal pain. Negative for vomiting, diarrhea, constipation, blood in stool and melena.  Genitourinary: Negative.   Musculoskeletal: Negative.   Skin: Negative.   Neurological: Negative.   Endo/Heme/Allergies: Negative.   Psychiatric/Behavioral: Negative.     Blood pressure 106/69, pulse 65, temperature 98.3 F (36.8 C), temperature source Oral, resp. rate 20, height 5\' 7"  (1.702 m), weight 163 lb 5.8 oz (74.1 kg), last menstrual period 05/26/2012, SpO2 100.00%. Physical Exam  Constitutional: She is oriented to person, place, and time. She appears well-developed and well-nourished. No distress.  HENT:  Head: Normocephalic and atraumatic.  Nose: Nose normal.  Eyes: Conjunctivae and EOM are normal. Pupils are equal, round, and reactive to light. Right eye exhibits no discharge. Left eye exhibits no discharge. No scleral icterus.  Neck: Normal range of motion. Neck supple. No JVD present. No tracheal deviation present. No thyromegaly present.  Cardiovascular: Normal rate, regular rhythm, normal heart sounds and intact distal pulses.  Exam reveals no gallop.   No murmur heard. Respiratory: Effort normal and breath sounds normal. No stridor. No respiratory distress. She has no wheezes. She has no rales. She exhibits no tenderness.  GI: She exhibits no distension and no mass. There is tenderness (pain in mid epigastric area and RUQ). There is no rebound and no guarding.  Musculoskeletal: She exhibits no edema and no tenderness.  Lymphadenopathy:    She has no cervical adenopathy.  Neurological: She is alert and oriented to person, place, and time. No cranial nerve deficit.  Skin: Skin is warm and dry. No rash noted. She is not diaphoretic. No erythema. No pallor.  Psychiatric: She has a normal mood and affect. Her behavior is normal.  Judgment and thought content normal.     Assessment/Plan  1. Acute cholecystitis/cholelithiasis  Plan:  Hydrate, IV antibiotics, and probable surgery later today  [facility] physician assistant for Dr. Darnell Level   Marsia Cino 06/13/2012, 12:45 PM

## 2012-06-13 NOTE — Op Note (Signed)
Procedure Note  Pre-operative Diagnosis: Calculus of gallbladder with other cholecystitis, without mention of obstruction  Post-operative Diagnosis: Same  Surgeon:  Velora Heckler, MD, FACS  Procedure:  Laparoscopic cholecystectomy with intra-operative cholangiography  Assistant:  None    Anesthesia:  General  Indications: This patient presents with symptomatic gallbladder disease and will undergo laparoscopic cholecystectomy with intraoperative cholangiography.  Procedure Details: The patient was seen in the pre-op holding area. The risks, benefits, complications, treatment options, and expected outcomes have been discussed with the patient. The patient and/or family agreed with the proposed plan and signed the informed consent form.  The patient was taken to Operating Room, identified as Janet Snyder and the procedure verified as Laparoscopic Cholecystectomy with Intraoperative Cholangiogram. A "time out" was completed and the above information confirmed.  Prior to the induction of general anesthesia, antibiotic prophylaxis was administered. General endotracheal anesthesia was then administered and tolerated well. After the induction, the abdomen was prepped in the usual strict aseptic fashion. The patient was in the supine position.  An incision was made in the skin near the umbilicus. The midline fascia was incised and the peritoneal cavity entered and the Hasson canula was introduced under direct vision. Hasson canula was secured with a pursestring 0-Vicryl suture. Pneumoperitoneum was then established with carbon dioxide and tolerated well without any adverse changes in the patient's vital signs. Additional trocars were introduced under direct vision along the right costal margin in the midline, mid-clavicular line, and anterior axillary line.  The gallbladder was identified and the fundus grasped and retracted cephalad. Adhesions were taken down bluntly and with the electrocautery  as needed, taking care not to injure any adjacent structures. The infundibulum was grasped and retracted laterally, exposing the peritoneum overlying the triangle of Calot. This was incised and structures exposed in a blunt fashion. The cystic duct was clearly identified and bluntly dissected circumferentially and clipped at the neck of the gallbladder.  An incision was made in the cystic duct and the cholangiogram catheter introduced. The catheter was secured using an ligaclip.  Real-time cholangiography was performed using the C-arm.  There was rapid filling of a normal caliber common bile duct.  There was reflux of contrast into the left and right hepatic ductal systems.  There was free flow distally into the duodenum without filling defect or obstruction.  Catheter was removed from the peritoneal cavity.  The cystic duct was then triply ligated with surgical clips and divided. The cystic artery was identified, dissected circumferentially, ligated with ligaclips, and divided.   The gallbladder was dissected from the liver bed with the electrocautery used for hemostasis. The gallbladder was completely removed and placed into an endocatch bag. The right upper quadrant was irrigated and inspected. Hemostasis was achieved with the electrocautery. Warm saline irrigation was utilized and was repeatedly aspirated until clear.  Pneumoperitoneum was released after viewing removal of the trocars with good hemostasis noted. The umbilical wound was irrigated and the fascia was then closed with the pursestring suture.  The skin was then closed with 4-0 Monocril subcuticular sutures and sterile dressings were applied.  Instrument, sponge, and needle counts were correct at the conclusion of the case.  The patient tolerated the procedure well.  Estimated Blood Loss: Minimal         Drains: none         Specimens: Gallbladder to pathology         Disposition: PACU - hemodynamically stable.  Condition:  stable   Velora Heckler, MD, [facility] Office: [phone]

## 2012-06-13 NOTE — H&P (Signed)
General Surgery [facility]  Patient discussed with ER MD this AM.  Patient seen and examined in OR holding area.  History reviewed with Will Marlyne Beards as documented.  Plan to proceed with laparoscopic cholecystectomy with IOC for symptomatic cholelithiasis and unrelenting biliary colic.  The risks and benefits of the procedure have been discussed at length with the patient.  The patient understands the proposed procedure, potential alternative treatments, and the course of recovery to be expected.  All of the patient's questions have been answered at this time.  The patient wishes to proceed with surgery.  Velora Heckler, MD, [facility] Office: [phone]

## 2012-06-13 NOTE — ED Notes (Signed)
Epigastric pain onset 0500 this am no vomiting or diarrhea denies fever or chills

## 2012-06-13 NOTE — Anesthesia Preprocedure Evaluation (Addendum)
Anesthesia Evaluation  Patient identified by MRN, date of birth, ID band Patient awake    Reviewed: Allergy & Precautions, H&P , NPO status , Patient's Chart, lab work & pertinent test results  Airway Mallampati: II TM Distance: >3 FB Neck ROM: full    Dental  (+) Dental Advisory Given and Chipped Small chip left front tooth:   Pulmonary neg pulmonary ROS, shortness of breath and with exertion,  breath sounds clear to auscultation  Pulmonary exam normal       Cardiovascular Exercise Tolerance: Good + Orthopnea negative cardio ROS  Rhythm:regular Rate:Normal     Neuro/Psych negative neurological ROS  negative psych ROS   GI/Hepatic negative GI ROS, Neg liver ROS,   Endo/Other  negative endocrine ROS  Renal/GU negative Renal ROS  negative genitourinary   Musculoskeletal   Abdominal   Peds  Hematology negative hematology ROS (+)   Anesthesia Other Findings   Reproductive/Obstetrics negative OB ROS                          Anesthesia Physical Anesthesia Plan  ASA: II  Anesthesia Plan: General   Post-op Pain Management:    Induction: Intravenous  Airway Management Planned: Oral ETT  Additional Equipment:   Intra-op Plan:   Post-operative Plan: Extubation in OR  Informed Consent: I have reviewed the patients History and Physical, chart, labs and discussed the procedure including the risks, benefits and alternatives for the proposed anesthesia with the patient or authorized representative who has indicated his/her understanding and acceptance.   Dental Advisory Given  Plan Discussed with: CRNA and Surgeon  Anesthesia Plan Comments:         Anesthesia Quick Evaluation

## 2012-06-13 NOTE — Anesthesia Postprocedure Evaluation (Signed)
  Anesthesia Post-op Note  Patient: Janet Snyder  Procedure(s) Performed: Procedure(s) (LRB): LAPAROSCOPIC CHOLECYSTECTOMY WITH INTRAOPERATIVE CHOLANGIOGRAM (N/A)  Patient Location: PACU  Anesthesia Type: General  Level of Consciousness: awake and alert   Airway and Oxygen Therapy: Patient Spontanous Breathing  Post-op Pain: mild  Post-op Assessment: Post-op Vital signs reviewed, Patient's Cardiovascular Status Stable, Respiratory Function Stable, Patent Airway and No signs of Nausea or vomiting  Last Vitals:  Filed Vitals:   06/13/12 1600  BP: 104/67  Pulse: 57  Temp:   Resp: 13    Post-op Vital Signs: stable   Complications: No apparent anesthesia complications

## 2012-06-13 NOTE — Progress Notes (Signed)
Notified Dr. Gerrit Friends of patient's arrival to room 1517

## 2012-06-13 NOTE — Progress Notes (Signed)
Received to room 1517 via ambulance.  Patient is alert and oriented, 6/10 epigastric abdominal pain, states has not had food since 3/17 at 7pm and liquids 3/18 at 6am, currently NPO at this time awaiting to be seen by Dr. Gerrit Friends.  Patient has family with her at this time and Selena Batten the Admission RN present in the room.  NS infusing at 62ml/hour in right [facility] with 20g angio present

## 2012-06-13 NOTE — ED Provider Notes (Signed)
History     CSN: [id number]  Arrival date & time 06/13/12  4098   First MD Initiated Contact with Patient 06/13/12 [phone]      Chief Complaint  Patient presents with  . Abdominal Pain    (Consider location/radiation/quality/duration/timing/severity/associated sxs/prior treatment) Patient is a 37 y.o. female presenting with abdominal pain.  Abdominal Pain  Pt reports sudden onset of severe aching epigastric and RUQ abdominal pain earlier this AM. Associated with nausea but no vomiting, diarrhea or fever. She had endometrial biopsy about a week ago, no dysuria or vaginal bleeding. She was given Rx for Flagyl, but has not gotten it filled. She tried to take a Prilosec with no improvement.   Past Medical History  Diagnosis Date  . SOB (shortness of breath)   . PNA (pneumonia)   . Orthopnea     Past Surgical History  Procedure Laterality Date  . Foot surgery      Family History  Problem Relation Age of Onset  . Hypertension Father   . Heart failure Father     cva  . Heart attack Father     History  Substance Use Topics  . Smoking status: Never Smoker   . Smokeless tobacco: Not on file  . Alcohol Use: No    OB History   Grav Para Term Preterm Abortions TAB SAB Ect Mult Living   1               Review of Systems  Gastrointestinal: Positive for abdominal pain.   All other systems reviewed and are negative except as noted in HPI.   Allergies  Sulfonamide derivatives  Home Medications   Current Outpatient Rx  Name  Route  Sig  Dispense  Refill  . metroNIDAZOLE (FLAGYL) 500 MG tablet   Oral   Take 1 tablet (500 mg total) by mouth 2 (two) times daily.   14 tablet   0   . Prenatal Vit-Fe Fumarate-FA (PRENATAL MULTIVITAMIN) TABS   Oral   Take 1 tablet by mouth daily.           LMP 05/26/2012  Physical Exam  Nursing note and vitals reviewed. Constitutional: She is oriented to person, place, and time. She appears well-developed and well-nourished.   uncomfortable  HENT:  Head: Normocephalic and atraumatic.  Eyes: EOM are normal. Pupils are equal, round, and reactive to light.  Neck: Normal range of motion. Neck supple.  Cardiovascular: Normal rate, normal heart sounds and intact distal pulses.   Pulmonary/Chest: Effort normal and breath sounds normal.  Abdominal: Bowel sounds are normal. She exhibits no distension. There is tenderness (epigastric, RUQ). There is guarding. There is no rebound.  Musculoskeletal: Normal range of motion. She exhibits no edema and no tenderness.  Neurological: She is alert and oriented to person, place, and time. She has normal strength. No cranial nerve deficit or sensory deficit.  Skin: Skin is warm and dry. No rash noted.  Psychiatric: She has a normal mood and affect.    ED Course  Procedures (including critical care time)  Labs Reviewed  URINALYSIS, ROUTINE W REFLEX MICROSCOPIC - Abnormal; Notable for the following:    APPearance TURBID (*)    Ketones, ur 15 (*)    All other components within normal limits  CBC WITH DIFFERENTIAL - Abnormal; Notable for the following:    Platelets 134 (*)    Neutrophils Relative 79 (*)    All other components within normal limits  COMPREHENSIVE METABOLIC PANEL - Abnormal; Notable  for the following:    Potassium 3.4 (*)    Glucose, Bld 111 (*)    All other components within normal limits  URINE MICROSCOPIC-ADD ON - Abnormal; Notable for the following:    Bacteria, UA FEW (*)    All other components within normal limits  PREGNANCY, URINE  LIPASE, BLOOD   US Abdomen Complete  06/13/2012  *RADIOLOGY REPORT*  Clinical Data:  Right upper quadrant abdominal pain  COMPLETE ABDOMINAL ULTRASOUND  Comparison:  None.  Findings:  Gallbladder:  Multiple gallstones including a 1.5 cm stone in the gallbladder neck.  No gallbladder wall thickening or pericholecystic fluid.  Positive sonographic Murphy's sign.  Common bile duct:  Measures 4 mm.  Liver:  No focal lesion  identified.  Within normal limits in parenchymal echogenicity.  IVC:  Poorly visualized due to overlying bowel gas.  Pancreas:  Poorly visualized due to overlying bowel gas.  Spleen:  Measures 7.6 cm.  Right Kidney:  Measures 11.7 cm.  No mass or hydronephrosis.  Left Kidney:  Measures 11.0 cm.  No mass or hydronephrosis.  Abdominal aorta:  No aneurysm identified.  IMPRESSION: Cholelithiasis with positive sonographic Murphy's sign.  No gallbladder wall thickening or pericholecystic fluid.  This appearance is equivocal but early acute cholecystitis is possible.  Consider hepatobiliary nuclear medicine scan for further evaluation.   Original Report Authenticated By: Charline Bills, M.D.      1. Cholelithiases       MDM  Labs unremarkable. US shows cholelithiasis including 1.5cm stone in the neck of the GB. Pain is improved but still having some tenderness in RUQ. Discussed with Dr. Gerrit Friends on call for General Surgery. He will accept in transfer to [facility] for further evaluation.         Charles B. Bernette Mayers, MD 06/13/12 1017

## 2012-06-14 ENCOUNTER — Encounter (HOSPITAL_COMMUNITY): Payer: Self-pay | Admitting: Surgery

## 2012-06-14 MED ORDER — HYDROCODONE-ACETAMINOPHEN 5-325 MG PO TABS: 1.0000 | ORAL_TABLET | ORAL | Status: DC | PRN

## 2012-06-14 NOTE — Discharge Summary (Signed)
Physician Discharge Summary [facility]  Patient ID: ALLESSANDRA BERNARDI MRN: [id number] DOB/AGE: January 13, 1976 37 y.o.  Admit date: 06/13/2012 Discharge date: 06/14/2012  Admission Diagnoses:  Symptomatic cholelithiasis, unrelenting biliary colic  Discharge Diagnoses:  Principal Problem:   Cholelithiasis with cholecystitis   Discharged Condition: good  Hospital Course: patient admitted for observation after lap chole with IOC.  Post op course uncomplicated.  Pain well controlled.  Tolerated regular diet.  Prepared for discharge home on POD#1.  Consults: None  Significant Diagnostic Studies: none  Treatments: surgery: lap chole with IOC  Discharge Exam: Blood pressure 105/70, pulse 64, temperature 99 F (37.2 C), temperature source Oral, resp. rate 14, height 5\' 7"  (1.702 m), weight 163 lb 5.8 oz (74.1 kg), last menstrual period 05/26/2012, SpO2 100.00%. HEENT - clear Neck - soft Chest - clear Cor - RRR Abd - soft without distension; dressings dry and intact  Disposition: Home with family  Discharge Orders   Future Orders Complete By Expires     Diet - low sodium heart healthy  As directed     Discharge instructions  As directed     Comments:      [facility]  LAPAROSCOPIC SURGERY - POST-OP INSTRUCTIONS  Always review your discharge instruction sheet given to you by the facility where your surgery was performed.  A prescription for pain medication may be given to you upon discharge.  Take your pain medication as prescribed.  If narcotic pain medicine is not needed, then you may take acetaminophen (Tylenol) or ibuprofen (Advil) as needed.  Take your usually prescribed medications unless otherwise directed.  If you need a refill on your pain medication, please contact your pharmacy.  They will contact our office to request authorization. Prescriptions will not be filled after 5 P.M. or on weekends.  You should follow a light diet the  first few days after arrival home, such as soup and crackers or toast.  Be sure to include plenty of fluids daily.  Most patients will experience some swelling and bruising in the area of the incisions.  Ice packs will help.  Swelling and bruising can take several days to resolve.   It is common to experience some constipation if taking pain medication after surgery.  Increasing fluid intake and taking a stool softener (such as Colace) will usually help or prevent this problem from occurring.  A mild laxative (Milk of Magnesia or Miralax) should be taken according to package instructions if there are no bowel movements after 48 hours.  Unless discharge instructions indicate otherwise, you may remove your bandages 24-48 hours after surgery, and you may shower at that time.  You may have steri-strips (small skin tapes) in place directly over the incision.  These strips should be left on the skin for 7-10 days.  If your surgeon used skin glue on the incision, you may shower in 24 hours.  The glue will flake off over the next 2-3 weeks.  Any sutures or staples will be removed at the office during your follow-up visit.  ACTIVITIES:  You may resume regular (light) daily activities beginning the next day-such as daily self-care, walking, climbing stairs-gradually increasing activities as tolerated.  You may have sexual intercourse when it is comfortable.  Refrain from any heavy lifting or straining until approved by your doctor.  You may drive when you are no longer taking prescription pain medication, you can comfortably wear a seatbelt, and you can safely maneuver your car and  apply brakes.  You should see your doctor in the office for a follow-up appointment approximately 2-3 weeks after your surgery.  Make sure that you call for this appointment within a day or two after you arrive home to insure a convenient appointment time.  WHEN TO CALL YOUR DOCTOR: Fever over 101.0 Inability to urinate Continued  bleeding from incision Increased pain, redness, or drainage from the incision Increasing abdominal pain  The clinic staff is available to answer your questions during regular business hours.  Please don't hesitate to call and ask to speak to one of the nurses for clinical concerns.  If you have a medical emergency, go to the nearest emergency room or call 911.  A surgeon from [facility] is always on call for the hospital.  Velora Heckler, MD, [facility] Office: [phone] Toll Free:  [phone] FAX [phone]  Web site: [email or web address]    Increase activity slowly  As directed     Remove dressing in 24 hours  As directed         Medication List    TAKE these medications       HYDROcodone-acetaminophen 5-325 MG per tablet  Commonly known as:  NORCO/VICODIN  Take 1-2 tablets by mouth every 4 (four) hours as needed for pain.     multivitamin with minerals tablet  Take 1 tablet by mouth daily.     naproxen sodium 220 MG tablet  Commonly known as:  ANAPROX  Take 220 mg by mouth daily as needed.     vitamin E 1000 UNIT capsule  Take 1,000 Units by mouth daily.           Follow-up Information   Follow up with Velora Heckler, MD. Schedule an appointment as soon as possible for a visit in 2 weeks. (Call for follow up with the [facility] clinic or with Dr. Gerrit Friends if it is full.)    Contact information:   [address] [place] [address] [phone]       Velora Heckler, MD, [facility] Office: [phone]   Signed: Velora Heckler 06/14/2012, 9:17 AM

## 2012-06-14 NOTE — Progress Notes (Signed)
Pt has slept comfortably during the night with no complaints. Patient reports no pain. Will continue to monitor

## 2012-06-16 ENCOUNTER — Telehealth: Payer: Self-pay

## 2012-06-16 NOTE — Telephone Encounter (Signed)
Message copied by Rolla Plate on Fri Jun 16, 2012  1:08 PM ------      Message from: Jaymes Graff      Created: Wed Jun 14, 2012 11:15 PM       Please call the patient and let her know her endometrial biopsy is normal ------

## 2012-06-16 NOTE — Telephone Encounter (Signed)
Lm on vm tcb rgd labs 

## 2012-06-19 ENCOUNTER — Encounter (INDEPENDENT_AMBULATORY_CARE_PROVIDER_SITE_OTHER): Payer: Self-pay

## 2012-06-19 ENCOUNTER — Telehealth (INDEPENDENT_AMBULATORY_CARE_PROVIDER_SITE_OTHER): Payer: Self-pay

## 2012-06-19 NOTE — Telephone Encounter (Signed)
Spoke with pt informed lab results pt voice understanding

## 2012-06-19 NOTE — Telephone Encounter (Signed)
Lm on vm tcb rgd labs 

## 2012-06-19 NOTE — Telephone Encounter (Signed)
Per Dr Ardine Eng request rtw note for 06-20-12 created and copy at front desk for pt to pick up. Pt aware.

## 2012-06-27 ENCOUNTER — Encounter (INDEPENDENT_AMBULATORY_CARE_PROVIDER_SITE_OTHER): Payer: Managed Care, Other (non HMO)

## 2012-07-11 ENCOUNTER — Encounter (INDEPENDENT_AMBULATORY_CARE_PROVIDER_SITE_OTHER): Payer: Managed Care, Other (non HMO)

## 2013-06-27 ENCOUNTER — Encounter: Payer: Self-pay | Admitting: Internal Medicine

## 2013-08-15 ENCOUNTER — Encounter: Payer: Self-pay | Admitting: Internal Medicine

## 2013-08-17 ENCOUNTER — Ambulatory Visit: Payer: Managed Care, Other (non HMO) | Admitting: Internal Medicine

## 2014-01-28 ENCOUNTER — Encounter (HOSPITAL_COMMUNITY): Payer: Self-pay | Admitting: Surgery

## 2015-09-15 ENCOUNTER — Other Ambulatory Visit: Payer: Self-pay | Admitting: Obstetrics and Gynecology

## 2015-11-04 ENCOUNTER — Other Ambulatory Visit: Payer: Self-pay | Admitting: Obstetrics and Gynecology

## 2015-11-04 DIAGNOSIS — R2232 Localized swelling, mass and lump, left upper limb: Secondary | ICD-10-CM

## 2015-11-07 ENCOUNTER — Other Ambulatory Visit: Payer: Managed Care, Other (non HMO)

## 2015-11-11 ENCOUNTER — Ambulatory Visit
Admission: RE | Admit: 2015-11-11 | Discharge: 2015-11-11 | Disposition: A | Discharge status: Home / Self Care | Payer: Managed Care, Other (non HMO) | Source: Ambulatory Visit | Attending: Obstetrics and Gynecology | Admitting: Obstetrics and Gynecology

## 2015-11-11 ENCOUNTER — Other Ambulatory Visit: Payer: Self-pay | Admitting: Obstetrics and Gynecology

## 2015-11-11 DIAGNOSIS — R2232 Localized swelling, mass and lump, left upper limb: Secondary | ICD-10-CM

## 2016-10-01 ENCOUNTER — Other Ambulatory Visit: Payer: Self-pay | Admitting: Obstetrics and Gynecology

## 2016-10-01 DIAGNOSIS — Z1231 Encounter for screening mammogram for malignant neoplasm of breast: Secondary | ICD-10-CM

## 2016-11-10 ENCOUNTER — Other Ambulatory Visit: Payer: Self-pay | Admitting: Obstetrics and Gynecology

## 2016-11-10 DIAGNOSIS — Z1231 Encounter for screening mammogram for malignant neoplasm of breast: Secondary | ICD-10-CM

## 2016-11-11 ENCOUNTER — Ambulatory Visit
Admission: RE | Admit: 2016-11-11 | Discharge: 2016-11-11 | Disposition: A | Discharge status: Home / Self Care | Payer: Commercial Managed Care - PPO | Source: Ambulatory Visit | Attending: Obstetrics and Gynecology | Admitting: Obstetrics and Gynecology

## 2016-11-11 DIAGNOSIS — Z1231 Encounter for screening mammogram for malignant neoplasm of breast: Secondary | ICD-10-CM

## 2017-10-05 ENCOUNTER — Other Ambulatory Visit: Payer: Self-pay | Admitting: Family Medicine

## 2017-10-05 ENCOUNTER — Other Ambulatory Visit: Payer: Self-pay | Admitting: Obstetrics and Gynecology

## 2017-10-05 DIAGNOSIS — Z1231 Encounter for screening mammogram for malignant neoplasm of breast: Secondary | ICD-10-CM

## 2017-11-15 ENCOUNTER — Ambulatory Visit
Admission: RE | Admit: 2017-11-15 | Discharge: 2017-11-15 | Disposition: A | Discharge status: Home / Self Care | Payer: Commercial Managed Care - PPO | Source: Ambulatory Visit | Attending: Obstetrics and Gynecology | Admitting: Obstetrics and Gynecology

## 2017-11-15 DIAGNOSIS — Z1231 Encounter for screening mammogram for malignant neoplasm of breast: Secondary | ICD-10-CM

## 2018-01-30 ENCOUNTER — Other Ambulatory Visit: Payer: Self-pay | Admitting: Obstetrics and Gynecology

## 2018-03-15 ENCOUNTER — Other Ambulatory Visit: Payer: Self-pay | Admitting: Obstetrics and Gynecology

## 2018-03-15 DIAGNOSIS — N6320 Unspecified lump in the left breast, unspecified quadrant: Secondary | ICD-10-CM

## 2018-03-16 ENCOUNTER — Ambulatory Visit
Admission: RE | Admit: 2018-03-16 | Discharge: 2018-03-16 | Disposition: A | Discharge status: Home / Self Care | Payer: Commercial Managed Care - PPO | Source: Ambulatory Visit | Attending: Obstetrics and Gynecology | Admitting: Obstetrics and Gynecology

## 2018-03-16 DIAGNOSIS — N6320 Unspecified lump in the left breast, unspecified quadrant: Secondary | ICD-10-CM

## 2018-12-22 ENCOUNTER — Other Ambulatory Visit: Payer: Self-pay | Admitting: Obstetrics and Gynecology

## 2018-12-22 DIAGNOSIS — Z1231 Encounter for screening mammogram for malignant neoplasm of breast: Secondary | ICD-10-CM

## 2019-01-19 ENCOUNTER — Other Ambulatory Visit: Payer: Self-pay

## 2019-01-19 ENCOUNTER — Ambulatory Visit
Admission: RE | Admit: 2019-01-19 | Discharge: 2019-01-19 | Disposition: A | Discharge status: Home / Self Care | Payer: Commercial Managed Care - PPO | Source: Ambulatory Visit | Attending: Obstetrics and Gynecology | Admitting: Obstetrics and Gynecology

## 2019-01-19 DIAGNOSIS — Z1231 Encounter for screening mammogram for malignant neoplasm of breast: Secondary | ICD-10-CM

## 2019-05-23 DIAGNOSIS — R002 Palpitations: Secondary | ICD-10-CM

## 2019-05-23 HISTORY — DX: Palpitations: R00.2

## 2019-06-08 ENCOUNTER — Ambulatory Visit: Payer: Self-pay | Attending: Internal Medicine

## 2019-06-08 DIAGNOSIS — Z23 Encounter for immunization: Secondary | ICD-10-CM

## 2019-06-08 NOTE — Progress Notes (Signed)
   Covid-19 Vaccination Clinic  Name:  Janet Snyder    MRN: [id number] DOB: July 08, 1975  06/08/2019  [name] was observed post Covid-19 immunization for 15 minutes without incident. She was provided with Vaccine Information Sheet and instruction to access the V-Safe system.   [name] was instructed to call 911 with any severe reactions post vaccine: Marland Kitchen Difficulty breathing  . Swelling of face and throat  . A fast heartbeat  . A bad rash all over body  . Dizziness and weakness   Immunizations Administered    Name Date Dose VIS Date Route   Pfizer COVID-19 Vaccine 06/08/2019  3:23 PM 0.3 mL 03/09/2019 Intramuscular   Manufacturer: [place]   Lot: [id number]   [place]: [id number]

## 2019-07-02 ENCOUNTER — Ambulatory Visit: Payer: Self-pay | Attending: Internal Medicine

## 2019-07-02 DIAGNOSIS — Z23 Encounter for immunization: Secondary | ICD-10-CM

## 2019-07-02 NOTE — Progress Notes (Signed)
   Covid-19 Vaccination Clinic  Name:  SHAILY CASSATA    MRN: [id number] DOB: Aug 21, 1975  07/02/2019  Ms. Hanawalt was observed post Covid-19 immunization for 15 minutes without incident. She was provided with Vaccine Information Sheet and instruction to access the V-Safe system.   Ms. Lebeck was instructed to call 911 with any severe reactions post vaccine: Marland Kitchen Difficulty breathing  . Swelling of face and throat  . A fast heartbeat  . A bad rash all over body  . Dizziness and weakness   Immunizations Administered    Name Date Dose VIS Date Route   Pfizer COVID-19 Vaccine 07/02/2019  5:04 PM 0.3 mL 03/09/2019 Intramuscular   Manufacturer: [place]   Lot: [id number]   [place]: [id number]

## 2019-12-25 ENCOUNTER — Other Ambulatory Visit: Payer: Self-pay | Admitting: Obstetrics and Gynecology

## 2019-12-25 DIAGNOSIS — Z1231 Encounter for screening mammogram for malignant neoplasm of breast: Secondary | ICD-10-CM

## 2020-01-21 ENCOUNTER — Other Ambulatory Visit: Payer: Self-pay

## 2020-01-21 ENCOUNTER — Ambulatory Visit
Admission: RE | Admit: 2020-01-21 | Discharge: 2020-01-21 | Disposition: A | Discharge status: Home / Self Care | Payer: Managed Care, Other (non HMO) | Source: Ambulatory Visit | Attending: Obstetrics and Gynecology | Admitting: Obstetrics and Gynecology

## 2020-01-21 DIAGNOSIS — Z1231 Encounter for screening mammogram for malignant neoplasm of breast: Secondary | ICD-10-CM

## 2020-05-07 IMAGING — MG DIGITAL SCREENING BILAT W/ TOMO W/ CAD
8 series · 8 of 24 positions shown · non-contrast
Comparison: Previous exam(s).

CLINICAL DATA: Screening.

EXAM:
DIGITAL SCREENING BILATERAL MAMMOGRAM WITH TOMO AND CAD

[R CC synth-2D]
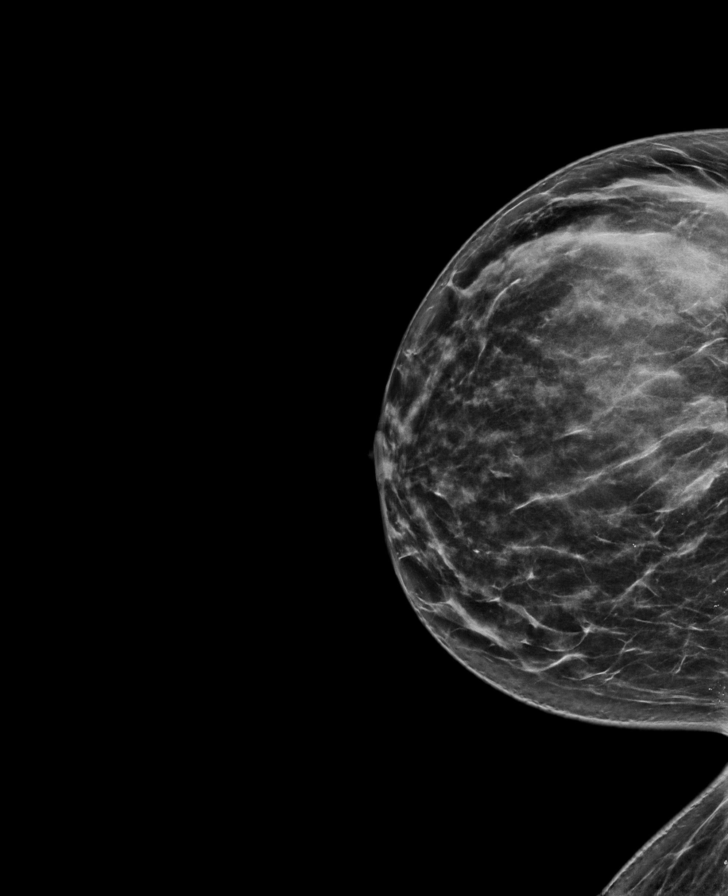

[R MLO synth-2D]
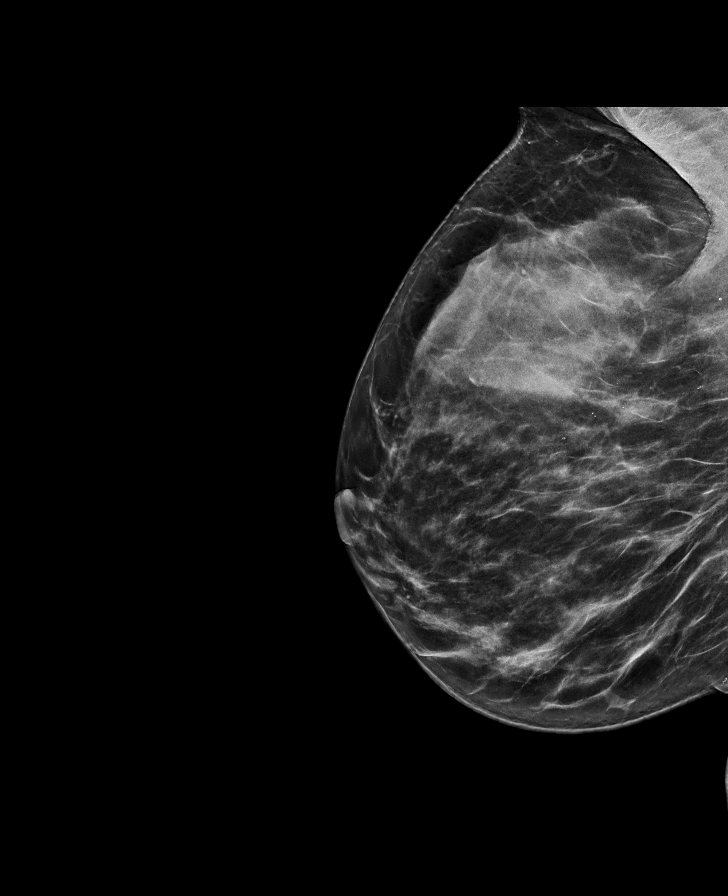

[L CC synth-2D]
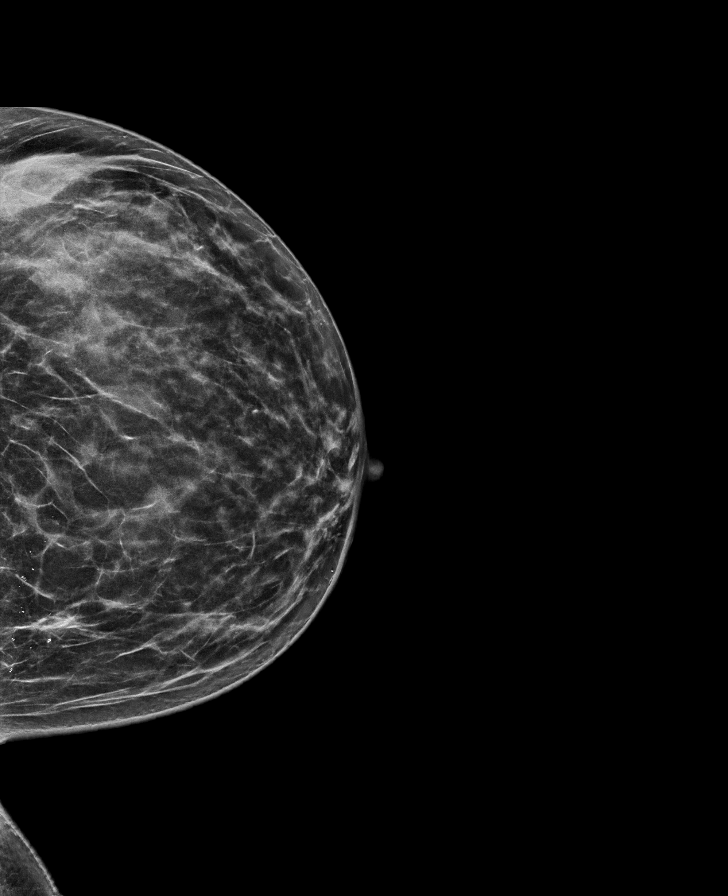

[L MLO synth-2D]
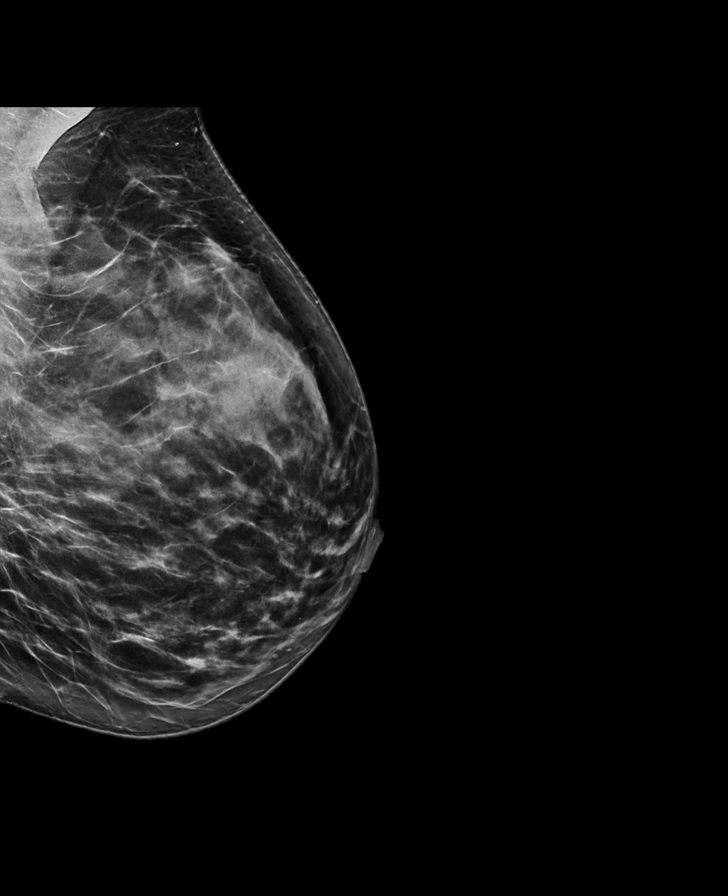

[R CC tomo · tomo slice 33/64.0]
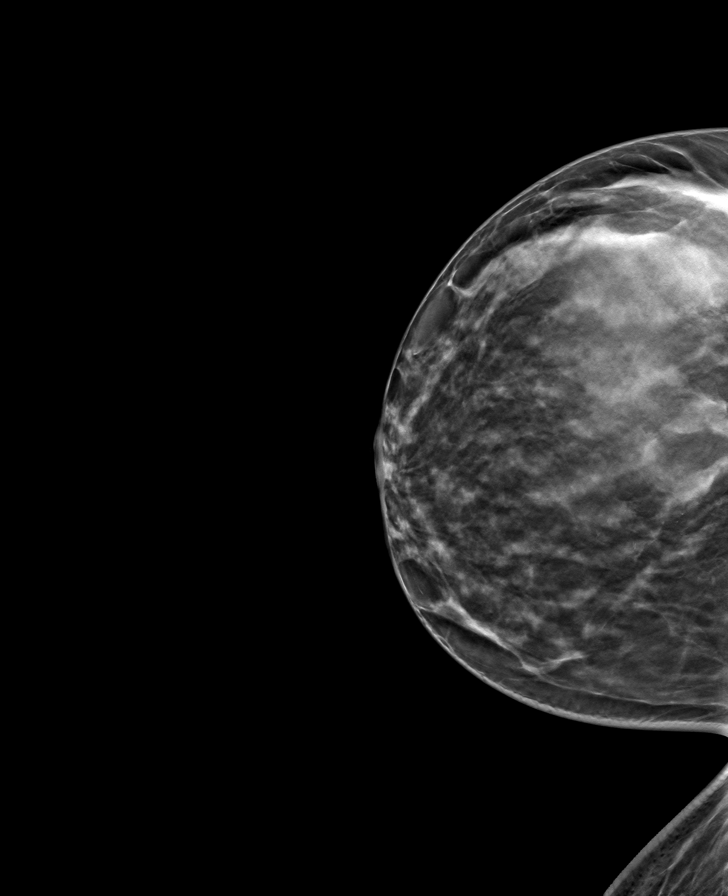

[L CC tomo · tomo slice 31/62.0]
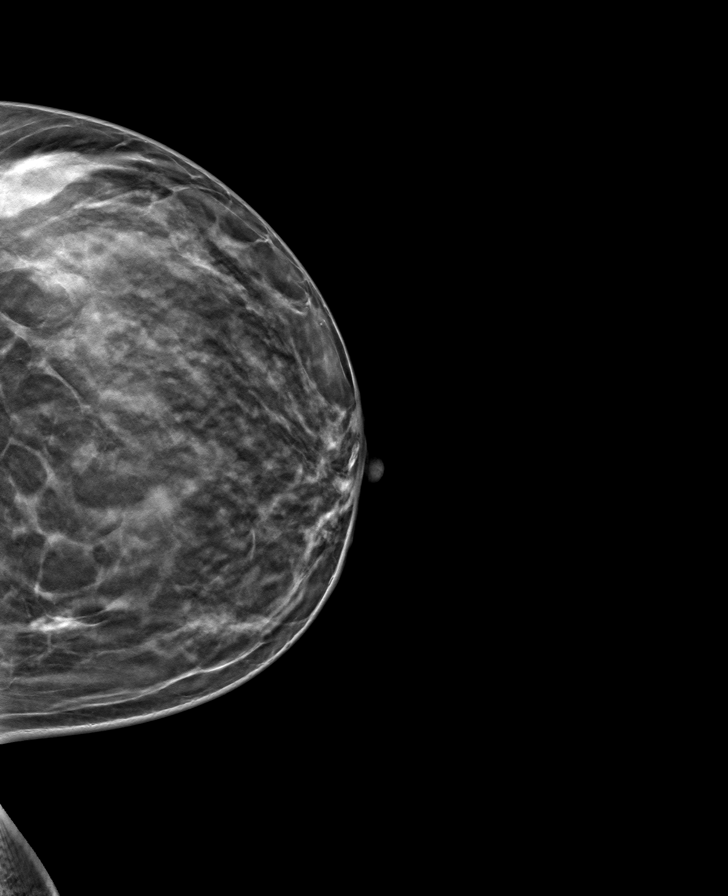

[R MLO tomo · tomo slice 36/71.0]
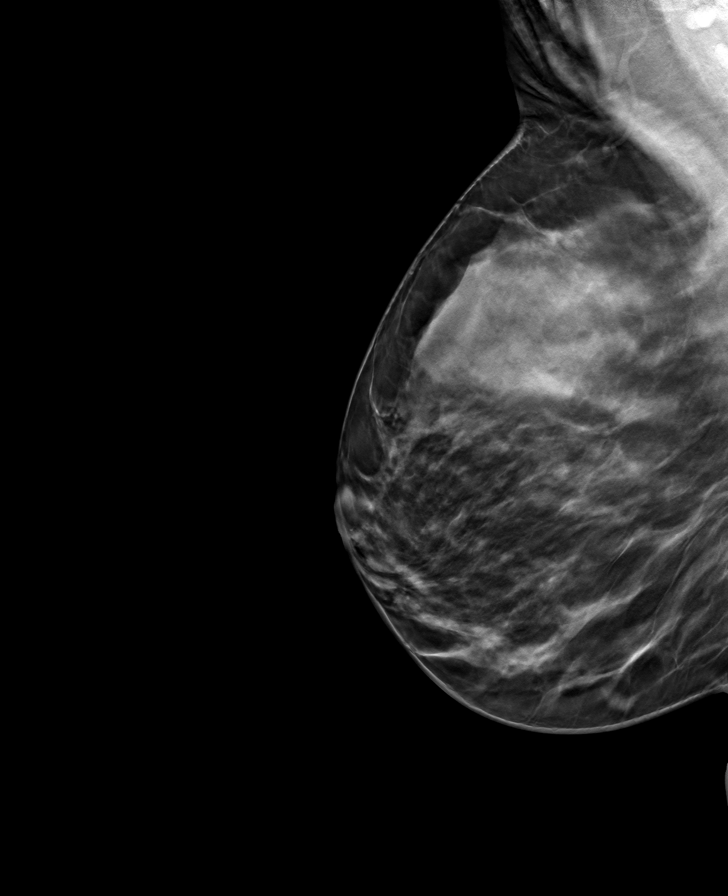

[L MLO tomo · tomo slice 37/73.0]
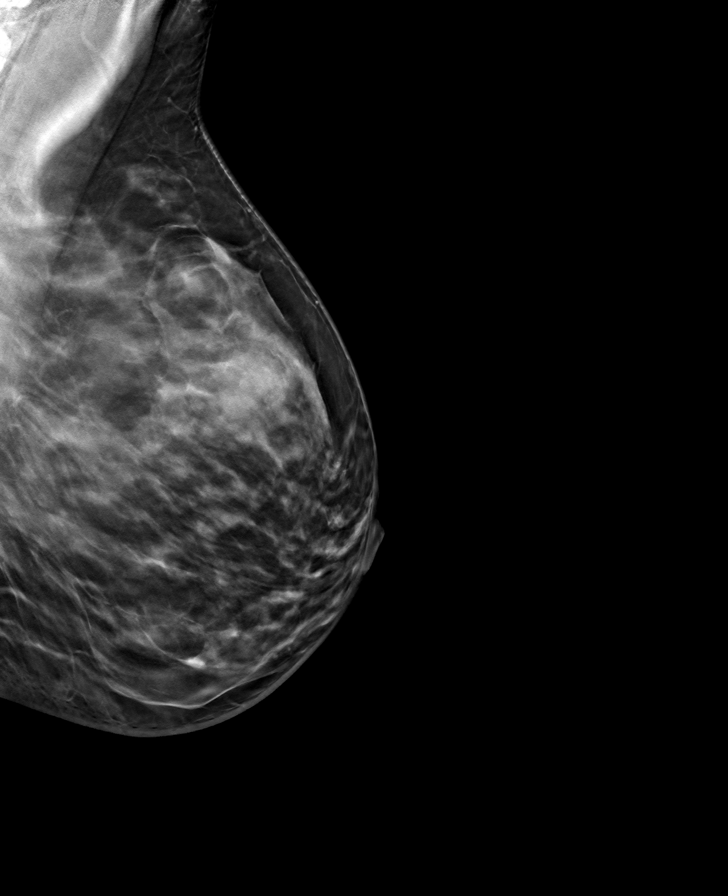

[8 of 24 positions shown; findings below may reference images not displayed]

ACR Breast Density Category d: The breast tissue is extremely dense,
which lowers the sensitivity of mammography
FINDINGS: There are no findings suspicious for malignancy. Images were
processed with CAD.
IMPRESSION: No mammographic evidence of malignancy. A result letter of this
screening mammogram will be mailed directly to the patient.

RECOMMENDATION:
Screening mammogram in one year. (Code:[id number])

BI-RADS CATEGORY  1: Negative.

## 2020-12-17 ENCOUNTER — Other Ambulatory Visit: Payer: Self-pay | Admitting: Obstetrics and Gynecology

## 2020-12-17 DIAGNOSIS — Z1231 Encounter for screening mammogram for malignant neoplasm of breast: Secondary | ICD-10-CM

## 2021-01-21 ENCOUNTER — Ambulatory Visit
Admission: RE | Admit: 2021-01-21 | Discharge: 2021-01-21 | Disposition: A | Discharge status: Home / Self Care | Payer: Managed Care, Other (non HMO) | Source: Ambulatory Visit | Attending: Obstetrics and Gynecology | Admitting: Obstetrics and Gynecology

## 2021-01-21 ENCOUNTER — Other Ambulatory Visit: Payer: Self-pay

## 2021-01-21 DIAGNOSIS — Z1231 Encounter for screening mammogram for malignant neoplasm of breast: Secondary | ICD-10-CM

## 2021-01-26 ENCOUNTER — Other Ambulatory Visit: Payer: Self-pay | Admitting: Obstetrics and Gynecology

## 2021-01-26 DIAGNOSIS — R928 Other abnormal and inconclusive findings on diagnostic imaging of breast: Secondary | ICD-10-CM

## 2021-01-29 ENCOUNTER — Ambulatory Visit
Admission: RE | Admit: 2021-01-29 | Discharge: 2021-01-29 | Disposition: A | Discharge status: Home / Self Care | Payer: Managed Care, Other (non HMO) | Source: Ambulatory Visit | Attending: Obstetrics and Gynecology | Admitting: Obstetrics and Gynecology

## 2021-01-29 ENCOUNTER — Other Ambulatory Visit: Payer: Self-pay

## 2021-01-29 ENCOUNTER — Other Ambulatory Visit: Payer: Self-pay | Admitting: Obstetrics and Gynecology

## 2021-01-29 DIAGNOSIS — R928 Other abnormal and inconclusive findings on diagnostic imaging of breast: Secondary | ICD-10-CM

## 2021-01-31 ENCOUNTER — Other Ambulatory Visit: Payer: Managed Care, Other (non HMO)

## 2021-02-03 ENCOUNTER — Ambulatory Visit
Admission: RE | Admit: 2021-02-03 | Discharge: 2021-02-03 | Disposition: A | Discharge status: Home / Self Care | Payer: Managed Care, Other (non HMO) | Source: Ambulatory Visit | Attending: Obstetrics and Gynecology | Admitting: Obstetrics and Gynecology

## 2021-02-03 ENCOUNTER — Other Ambulatory Visit: Payer: Self-pay

## 2021-02-03 DIAGNOSIS — R928 Other abnormal and inconclusive findings on diagnostic imaging of breast: Secondary | ICD-10-CM

## 2021-02-04 ENCOUNTER — Other Ambulatory Visit: Payer: Self-pay | Admitting: Obstetrics and Gynecology

## 2021-02-04 DIAGNOSIS — C50911 Malignant neoplasm of unspecified site of right female breast: Secondary | ICD-10-CM

## 2021-02-05 ENCOUNTER — Other Ambulatory Visit: Payer: Managed Care, Other (non HMO)

## 2021-02-05 ENCOUNTER — Telehealth: Payer: Self-pay | Admitting: Hematology

## 2021-02-05 NOTE — Telephone Encounter (Signed)
Spoke to patient to confirm morning clinic appointment for 11/16, packet emailed to patient

## 2021-02-06 ENCOUNTER — Encounter: Payer: Self-pay | Admitting: *Deleted

## 2021-02-06 DIAGNOSIS — C50211 Malignant neoplasm of upper-inner quadrant of right female breast: Secondary | ICD-10-CM | POA: Insufficient documentation

## 2021-02-06 DIAGNOSIS — Z170 Estrogen receptor positive status [ER+]: Secondary | ICD-10-CM | POA: Insufficient documentation

## 2021-02-10 NOTE — Progress Notes (Signed)
Radiation Oncology         [phone]) [phone] ________________________________  Multidisciplinary Breast Oncology Clinic [facility]) Initial Outpatient Consultation  Name: Janet Snyder MRN: [id number]  Date: 02/11/2021  DOB: 1975-08-09  CC:Pcp, No  Rolm Bookbinder, MD   REFERRING PHYSICIAN: Rolm Bookbinder, MD  DIAGNOSIS: The encounter diagnosis was Malignant neoplasm of upper-inner quadrant of right breast in female, estrogen receptor positive ([place]).  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 Malignant neoplasm of upper-inner quadrant of right breast in female, estrogen receptor positive (HCC)  C50.211    Z17.0       HISTORY OF PRESENT ILLNESS::Janet Snyder is a 45 y.o. female who is presenting to the office today for evaluation of her newly diagnosed breast cancer. She is accompanied by her husband. She is doing well overall.   She had routine screening mammography on 01/21/21 showing a possible abnormality in the right breast. She underwent unilateral right diagnostic mammography with tomography and right breast ultrasonography at [facility] on 01/29/21 showing: a highly suspicious right breast mass at the 1 o'clock position with an associated distortion. Calcifications were noted within the mass, and anteriorly and posteriorly to the mass as well. (Of note: screening mammogram on 01/19/2019 showed no mammographic evidence of malignancy).   Right breast biopsy of the mass located at the 1 o'clock position on 02/03/21 showed: grade 2 invasive mammary carcinoma measuring 1.1 cm in the greatest linear extent, and mammary carcinoma in-situ. Prognostic indicators significant for: estrogen receptor, 100% positive and progesterone receptor, 100% positive, both with strong staining intensity. Proliferation marker Ki67 at 5%. HER2 negative.  Menarche: 45 years old Age at first live birth: 45  years old GP: 3 LMP: 01/23/21 (still having periods) Contraceptive: Yes, from 2004-2006 HRT: none   The patient was referred today for presentation in the multidisciplinary conference.  Radiology studies and pathology slides were presented there for review and discussion of treatment options.  A consensus was discussed regarding potential next steps.  PREVIOUS RADIATION THERAPY: No  PAST MEDICAL HISTORY:  Past Medical History:  Diagnosis Date   Orthopnea    PNA (pneumonia)    SOB (shortness of breath)     PAST SURGICAL HISTORY: Past Surgical History:  Procedure Laterality Date   CESAREAN SECTION     CHOLECYSTECTOMY N/A 06/13/2012   Procedure: LAPAROSCOPIC CHOLECYSTECTOMY WITH INTRAOPERATIVE CHOLANGIOGRAM;  Surgeon: Earnstine Regal, MD;  Location: [facility];  Service: General;  Laterality: N/A;   FOOT SURGERY      FAMILY HISTORY:  Family History  Problem Relation Age of Onset   Hypertension Father    Heart failure Father        cva   Heart attack Father    Cancer Other        breast cancer   Cancer Other        blood cancer    SOCIAL HISTORY:  Social History   Socioeconomic History   Marital status: Married    Spouse name: Not on file   Number of children: 3   Years of education: Not on file   Highest education level: Not on file  Occupational History   Not on file  Tobacco Use   Smoking status: Never   Smokeless tobacco: Never  Substance and Sexual Activity   Alcohol use: No   Drug use: No   Sexual activity: Not  Currently  Other Topics Concern   Not on file  Social History Narrative   Not on file   Social Determinants of Health   Financial Resource Strain: Not on file  Food Insecurity: Not on file  Transportation Needs: Not on file  Physical Activity: Not on file  Stress: Not on file  Social Connections: Not on file    ALLERGIES:  Allergies  Allergen Reactions   Sulfonamide Derivatives     MEDICATIONS:  Current Outpatient Medications   Medication Sig Dispense Refill   Multiple Vitamins-Minerals (MULTIVITAMIN WITH MINERALS) tablet Take 1 tablet by mouth daily.     naproxen sodium (ANAPROX) 220 MG tablet Take 220 mg by mouth daily as needed. (Patient not taking: Reported on 02/11/2021)     vitamin E 1000 UNIT capsule Take 1,000 Units by mouth daily.     No current facility-administered medications for this encounter.    REVIEW OF SYSTEMS: A 10+ POINT REVIEW OF SYSTEMS WAS OBTAINED including neurology, dermatology, psychiatry, cardiac, respiratory, lymph, extremities, GI, GU, musculoskeletal, constitutional, reproductive, HEENT. On the provided form, she reports wearing glasses. She denies any other symptoms.    PHYSICAL EXAM:   Vitals with BMI 02/11/2021  Height $Remov'5\' 7"'ZZSHFe$   Weight 161 lbs 14 oz  BMI 76.22  Systolic 633  Diastolic 64  Pulse 64   Lungs are clear to auscultation bilaterally. Heart has regular rate and rhythm. No palpable cervical, supraclavicular, or axillary adenopathy. Abdomen soft, non-tender, normal bowel sounds. Breast: Left breast with no palpable mass, nipple discharge, or bleeding. Right breast with a faint biopsy site in the 3 o'clock position.   KPS = 100  100 - Normal; no complaints; no evidence of disease. 90   - Able to carry on normal activity; minor signs or symptoms of disease. 80   - Normal activity with effort; some signs or symptoms of disease. 38   - Cares for self; unable to carry on normal activity or to do active work. 60   - Requires occasional assistance, but is able to care for most of his personal needs. 50   - Requires considerable assistance and frequent medical care. 54   - Disabled; requires special care and assistance. 14   - Severely disabled; hospital admission is indicated although death not imminent. 46   - Very sick; hospital admission necessary; active supportive treatment necessary. 10   - Moribund; fatal processes progressing rapidly. 0     - Dead  Karnofsky DA,  Abelmann [place], Craver LS and Burchenal [facility] [phone]) The use of the nitrogen mustards in the palliative treatment of carcinoma: with particular reference to bronchogenic carcinoma Cancer 1 634-56  LABORATORY DATA:  Lab Results  Component Value Date   WBC 4.7 02/11/2021   HGB 12.0 02/11/2021   HCT 35.2 (L) 02/11/2021   MCV 90.0 02/11/2021   PLT 117 (L) 02/11/2021   Lab Results  Component Value Date   NA 140 02/11/2021   K 4.1 02/11/2021   CL 110 02/11/2021   CO2 23 02/11/2021   Lab Results  Component Value Date   ALT 10 02/11/2021   AST 14 (L) 02/11/2021   ALKPHOS 40 02/11/2021   BILITOT 0.5 02/11/2021    PULMONARY FUNCTION TEST:   Recent Review Flowsheet Data   There is no flowsheet data to display.     RADIOGRAPHY: US BREAST LTD UNI RIGHT INC AXILLA  Result Date: 01/29/2021 CLINICAL DATA:  The patient was called back from screening mammography due to  possible distortion in the right breast. EXAM: DIGITAL DIAGNOSTIC UNILATERAL RIGHT MAMMOGRAM WITH TOMOSYNTHESIS AND CAD; ULTRASOUND RIGHT BREAST LIMITED TECHNIQUE: Right digital diagnostic mammography and breast tomosynthesis was performed. The images were evaluated with computer-aided detection.; Targeted ultrasound examination of the right breast was performed COMPARISON:  Previous exam(s). ACR Breast Density Category c: The breast tissue is heterogeneously dense, which may obscure small masses. FINDINGS: There is persistent distortion in the upper inner right breast at a mid to posterior depth. There are calcifications within the region of distortion. On magnified views, there are calcifications extending anterior and posterior to the distortion. Including the calcifications anterior and posterior to the distortion, the findings span 8 cm. Targeted ultrasound is performed, showing a suspicious mass containing at least 1 calcification in the right breast at 1 o'clock, 2 cm from the nipple measuring 13 x 14 x 12 mm. No axillary adenopathy.  IMPRESSION: Highly suspicious right breast mass at 1 o'clock with associated distortion. There are calcifications in the mass. Calcifications are seen anterior and posterior to the mass as well. RECOMMENDATION: Recommend ultrasound-guided biopsy of the 1 o'clock right breast mass. Assuming the biopsy demonstrates malignancy, recommend breast MRI to assess extent of disease if lumpectomy is being considered. After breast MRI, the patient will need additional biopsies to prove extent of disease if lumpectomy is being considered. The additional biopsies could be based on MRI findings or the calcifications anterior and posterior to the mass could be biopsied. I have discussed the findings and recommendations with the patient. If applicable, a reminder letter will be sent to the patient regarding the next appointment. BI-RADS CATEGORY  5: Highly suggestive of malignancy. Electronically Signed   By: Dorise Bullion III M.D.   On: 01/29/2021 16:00  MM DIAG BREAST TOMO UNI RIGHT  Result Date: 01/29/2021 CLINICAL DATA:  The patient was called back from screening mammography due to possible distortion in the right breast. EXAM: DIGITAL DIAGNOSTIC UNILATERAL RIGHT MAMMOGRAM WITH TOMOSYNTHESIS AND CAD; ULTRASOUND RIGHT BREAST LIMITED TECHNIQUE: Right digital diagnostic mammography and breast tomosynthesis was performed. The images were evaluated with computer-aided detection.; Targeted ultrasound examination of the right breast was performed COMPARISON:  Previous exam(s). ACR Breast Density Category c: The breast tissue is heterogeneously dense, which may obscure small masses. FINDINGS: There is persistent distortion in the upper inner right breast at a mid to posterior depth. There are calcifications within the region of distortion. On magnified views, there are calcifications extending anterior and posterior to the distortion. Including the calcifications anterior and posterior to the distortion, the findings span 8 cm.  Targeted ultrasound is performed, showing a suspicious mass containing at least 1 calcification in the right breast at 1 o'clock, 2 cm from the nipple measuring 13 x 14 x 12 mm. No axillary adenopathy. IMPRESSION: Highly suspicious right breast mass at 1 o'clock with associated distortion. There are calcifications in the mass. Calcifications are seen anterior and posterior to the mass as well. RECOMMENDATION: Recommend ultrasound-guided biopsy of the 1 o'clock right breast mass. Assuming the biopsy demonstrates malignancy, recommend breast MRI to assess extent of disease if lumpectomy is being considered. After breast MRI, the patient will need additional biopsies to prove extent of disease if lumpectomy is being considered. The additional biopsies could be based on MRI findings or the calcifications anterior and posterior to the mass could be biopsied. I have discussed the findings and recommendations with the patient. If applicable, a reminder letter will be sent to the patient regarding the next  appointment. BI-RADS CATEGORY  5: Highly suggestive of malignancy. Electronically Signed   By: Dorise Bullion III M.D.   On: 01/29/2021 16:00  MM 3D SCREEN BREAST BILATERAL  Result Date: 01/23/2021 CLINICAL DATA:  Screening. EXAM: DIGITAL SCREENING BILATERAL MAMMOGRAM WITH TOMOSYNTHESIS AND CAD TECHNIQUE: Bilateral screening digital craniocaudal and mediolateral oblique mammograms were obtained. Bilateral screening digital breast tomosynthesis was performed. The images were evaluated with computer-aided detection. COMPARISON:  Previous exam(s). ACR Breast Density Category c: The breast tissue is heterogeneously dense, which may obscure small masses. FINDINGS: In the right breast, possible distortion warrants further evaluation. In the left breast, no findings suspicious for malignancy. IMPRESSION: Further evaluation is suggested for possible distortion in the right breast. RECOMMENDATION: Diagnostic mammogram and  possibly ultrasound of the right breast. (Code:[id number]) The patient will be contacted regarding the findings, and additional imaging will be scheduled. BI-RADS CATEGORY  0: Incomplete. Need additional imaging evaluation and/or prior mammograms for comparison. Electronically Signed   By: Nolon Nations M.D.   On: 01/23/2021 12:07   MM CLIP PLACEMENT RIGHT  Result Date: 02/03/2021 CLINICAL DATA:  Status post ultrasound-guided biopsy of a RIGHT breast mass. EXAM: 3D DIAGNOSTIC RIGHT MAMMOGRAM POST ULTRASOUND BIOPSY COMPARISON:  Previous exam(s). FINDINGS: 3D Mammographic images were obtained following ultrasound guided biopsy of the RIGHT breast mass at the 1 o'clock axis. The biopsy marking clip is in expected position at the site of biopsy. IMPRESSION: Appropriate positioning of the ribbon shaped biopsy marking clip at the site of biopsy in the upper inner quadrant of the RIGHT breast corresponding to the targeted mass at the 1 o'clock axis. Final Assessment: Post Procedure Mammograms for Marker Placement Electronically Signed   By: Franki Cabot M.D.   On: 02/03/2021 14:30  [place] RT BREAST BX W LOC DEV 1ST LESION IMG BX SPEC US GUIDE  Addendum Date: 02/04/2021   ADDENDUM REPORT: 02/04/2021 13:25 ADDENDUM: Pathology revealed GRADE II INVASIVE MAMMARY CARCINOMA, MAMMARY CARCINOMA IN SITU of the RIGHT breast, 1 o'clock, (ribbon clip). This was found to be concordant by Dr. Franki Cabot. Pathology results were discussed with the patient by telephone by Stacie Acres, RN Nurse Navigator. The patient reported doing well after the biopsy with tenderness at the site. Post biopsy instructions and care were reviewed and questions were answered. The patient was encouraged to call [facility] for any additional concerns. The patient was referred to [facility] on February 11, 2021. ** Recommendation for MRI to assess  extent of disease if breast conservation is being considered. After breast MRI, the patient will need additional biopsies to prove extent of disease if breast conservation is being considered. The additional biopsies could be based on MRI findings or the calcifications anterior and posterior to the mass could be biopsied. Pathology results reported by Terie Purser, RN on 02/04/2021. Electronically Signed   By: Franki Cabot M.D.   On: 02/04/2021 13:25   Result Date: 02/04/2021 CLINICAL DATA:  Patient with a RIGHT breast mass with associated distortion presents today for ultrasound-guided core biopsy. EXAM: ULTRASOUND GUIDED RIGHT BREAST CORE NEEDLE BIOPSY COMPARISON:  Previous exam(s). PROCEDURE: I met with the patient and we discussed the procedure of ultrasound-guided biopsy, including benefits and alternatives. We discussed the high likelihood of a successful procedure. We discussed the risks of the procedure, including infection, bleeding, tissue injury, clip migration, and inadequate sampling. Informed written consent was given. The usual time-out protocol was  performed immediately prior to the procedure. Lesion quadrant: Upper outer quadrant Using sterile technique and 1% Lidocaine as local anesthetic, under direct ultrasound visualization, a 12 gauge spring-loaded device was used to perform biopsy of the RIGHT breast mass at the 1 o'clock axis using a inferomedial approach. At the conclusion of the procedure ribbon shaped tissue marker clip was deployed into the biopsy cavity. Follow up 2 view mammogram was performed and dictated separately. IMPRESSION: Ultrasound guided biopsy of the RIGHT breast mass at the 1 o'clock axis. No apparent complications. Electronically Signed: By: Franki Cabot M.D. On: 02/03/2021 14:13     IMPRESSION: Stage IA (cT1c, cN0, cM0) Right Breast UIQ, Invasive and in-situ Mammary (Ductal) Carcinoma, ER+ / PR+ / Her2-, Grade 2   Patient will need additional biopsies of  calcifications to determine the extent of the disease. Depending of the results of these biopsies, she may or may not be a candidate for breast conserving surgery.   Patient will be a good candidate for breast radiotherapy to the right breast if she is able to have breast conserving surgery.  we discussed the general course of radiation, potential side effects, and toxicities with radiation and the patient is interested in this approach.    PLAN:  Genetics MRI on 02/13/21,  Dr. Iran Planas 3.   Surgery TBD 4.  Oncotype  5.  Adjuvant radiation therapy if breast conserving surgery 6.  Aromatase Inhibitor    ------------------------------------------------  Blair Promise, PhD, MD  This document serves as a record of services personally performed by Gery Pray, MD. It was created on his behalf by Roney Mans, a trained medical scribe. The creation of this record is based on the scribe's personal observations and the provider's statements to them. This document has been checked and approved by the attending provider.

## 2021-02-11 ENCOUNTER — Other Ambulatory Visit: Payer: Self-pay

## 2021-02-11 ENCOUNTER — Inpatient Hospital Stay: Payer: Managed Care, Other (non HMO)

## 2021-02-11 ENCOUNTER — Encounter: Payer: Self-pay | Admitting: *Deleted

## 2021-02-11 ENCOUNTER — Encounter: Payer: Self-pay | Admitting: Physical Therapy

## 2021-02-11 ENCOUNTER — Inpatient Hospital Stay (HOSPITAL_BASED_OUTPATIENT_CLINIC_OR_DEPARTMENT_OTHER): Payer: Managed Care, Other (non HMO) | Admitting: Hematology

## 2021-02-11 ENCOUNTER — Encounter: Payer: Self-pay | Admitting: Hematology

## 2021-02-11 ENCOUNTER — Ambulatory Visit
Admission: RE | Admit: 2021-02-11 | Discharge: 2021-02-11 | Disposition: A | Discharge status: Home / Self Care | Payer: Managed Care, Other (non HMO) | Source: Ambulatory Visit | Attending: Radiation Oncology | Admitting: Radiation Oncology

## 2021-02-11 ENCOUNTER — Inpatient Hospital Stay (HOSPITAL_BASED_OUTPATIENT_CLINIC_OR_DEPARTMENT_OTHER): Payer: Managed Care, Other (non HMO) | Admitting: Genetic Counselor

## 2021-02-11 ENCOUNTER — Ambulatory Visit: Payer: Managed Care, Other (non HMO) | Attending: General Surgery | Admitting: Physical Therapy

## 2021-02-11 ENCOUNTER — Other Ambulatory Visit: Payer: Self-pay | Admitting: General Surgery

## 2021-02-11 VITALS — BP 110/64 | HR 64 | Temp 98.1°F | Resp 18 | Ht 67.0 in | Wt 161.9 lb

## 2021-02-11 DIAGNOSIS — Z170 Estrogen receptor positive status [ER+]: Secondary | ICD-10-CM

## 2021-02-11 DIAGNOSIS — C50211 Malignant neoplasm of upper-inner quadrant of right female breast: Secondary | ICD-10-CM

## 2021-02-11 DIAGNOSIS — Z808 Family history of malignant neoplasm of other organs or systems: Secondary | ICD-10-CM | POA: Insufficient documentation

## 2021-02-11 DIAGNOSIS — D696 Thrombocytopenia, unspecified: Secondary | ICD-10-CM | POA: Insufficient documentation

## 2021-02-11 DIAGNOSIS — Z803 Family history of malignant neoplasm of breast: Secondary | ICD-10-CM | POA: Insufficient documentation

## 2021-02-11 DIAGNOSIS — Z9049 Acquired absence of other specified parts of digestive tract: Secondary | ICD-10-CM | POA: Insufficient documentation

## 2021-02-11 DIAGNOSIS — R293 Abnormal posture: Secondary | ICD-10-CM | POA: Insufficient documentation

## 2021-02-11 LAB — CBC WITH DIFFERENTIAL (CANCER CENTER ONLY)
Abs Immature Granulocytes: 0.02 10*3/uL (ref 0.00–0.07)
Basophils Absolute: 0 10*3/uL (ref 0.0–0.1)
Basophils Relative: 0 %
Eosinophils Absolute: 0.1 10*3/uL (ref 0.0–0.5)
Eosinophils Relative: 1 %
HCT: 35.2 % — ABNORMAL LOW (ref 36.0–46.0)
Hemoglobin: 12 g/dL (ref 12.0–15.0)
Immature Granulocytes: 0 %
Lymphocytes Relative: 24 %
Lymphs Abs: 1.1 10*3/uL (ref 0.7–4.0)
MCH: 30.7 pg (ref 26.0–34.0)
MCHC: 34.1 g/dL (ref 30.0–36.0)
MCV: 90 fL (ref 80.0–100.0)
Monocytes Absolute: 0.2 10*3/uL (ref 0.1–1.0)
Monocytes Relative: 5 %
Neutro Abs: 3.3 10*3/uL (ref 1.7–7.7)
Neutrophils Relative %: 70 %
Platelet Count: 117 10*3/uL — ABNORMAL LOW (ref 150–400)
RBC: 3.91 MIL/uL (ref 3.87–5.11)
RDW: 11.9 % (ref 11.5–15.5)
WBC Count: 4.7 10*3/uL (ref 4.0–10.5)
nRBC: 0 % (ref 0.0–0.2)

## 2021-02-11 LAB — CMP (CANCER CENTER ONLY)
ALT: 10 U/L (ref 0–44)
AST: 14 U/L — ABNORMAL LOW (ref 15–41)
Albumin: 3.8 g/dL (ref 3.5–5.0)
Alkaline Phosphatase: 40 U/L (ref 38–126)
Anion gap: 7 (ref 5–15)
BUN: 12 mg/dL (ref 6–20)
CO2: 23 mmol/L (ref 22–32)
Calcium: 8.9 mg/dL (ref 8.9–10.3)
Chloride: 110 mmol/L (ref 98–111)
Creatinine: 0.85 mg/dL (ref 0.44–1.00)
GFR, Estimated: >60 mL/min (ref >60)
Glucose, Bld: 84 mg/dL (ref 70–99)
Potassium: 4.1 mmol/L (ref 3.5–5.1)
Sodium: 140 mmol/L (ref 135–145)
Total Bilirubin: 0.5 mg/dL (ref 0.3–1.2)
Total Protein: 6.9 g/dL (ref 6.5–8.1)

## 2021-02-11 LAB — GENETIC SCREENING ORDER

## 2021-02-11 NOTE — Therapy (Signed)
[facility] [place], [place], [address] Phone: [phone]   Fax:  [phone]  Physical Therapy Evaluation  Patient Details  Name: Janet Snyder MRN: [id number] Date of Birth: 01/13/1976 Referring Provider (PT): Dr. Rolm Bookbinder   Encounter Date: 02/11/2021   PT End of Session - 02/11/21 1019     Visit Number 1    Number of Visits 2    Date for PT Re-Evaluation 04/08/21    PT Start Time 0946    PT Stop Time 1003   Also saw pt from 8127 to 1031 for a total of 23 minutes   PT Time Calculation (min) 17 min    Activity Tolerance Patient tolerated treatment well    Behavior During Therapy [facility] for tasks assessed/performed             Past Medical History:  Diagnosis Date   Orthopnea    PNA (pneumonia)    SOB (shortness of breath)     Past Surgical History:  Procedure Laterality Date   CESAREAN SECTION     CHOLECYSTECTOMY N/A 06/13/2012   Procedure: LAPAROSCOPIC CHOLECYSTECTOMY WITH INTRAOPERATIVE CHOLANGIOGRAM;  Surgeon: Earnstine Regal, MD;  Location: [facility];  Service: General;  Laterality: N/A;   FOOT SURGERY      There were no vitals filed for this visit.    Subjective Assessment - 02/11/21 1012     Subjective Patient reports she is here today to be seen by her medical team for her newy diagnosed right breast cancer.    Patient is accompained by: Family member    Pertinent History Patient was diagnosed on 01/21/2021 with right grade II invasive ductal carcinoma breast cancer. It measures 1.4 cm in the upper inner quadrant with an 8 cm area of calcifications distortion which will be biopsied following an MRI on 02/13/2021. It is ER/PR positive and HER2 negative with a Ki67 of 5%.    Patient Stated Goals Reduce lymphedema risk and learn post op HEP    Currently in Pain? No/denies                [facility] PT Assessment - 02/11/21 0001       Assessment   Medical Diagnosis Right breast  cancer    Referring Provider (PT) Dr. Rolm Bookbinder    Onset Date/Surgical Date 01/21/21    Hand Dominance Right    Prior Therapy none      Precautions   Precautions Other (comment)    Precaution Comments active cancer      Restrictions   Weight Bearing Restrictions No      Balance Screen   Has the patient fallen in the past 6 months No    Has the patient had a decrease in activity level because of a fear of falling?  No    Is the patient reluctant to leave their home because of a fear of falling?  No      Home Environment   Living Environment Private residence    Living Arrangements Spouse/significant other;Children   Husband, 68, 16, and 46 y.o. 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    Leisure She runs 40 min/day 5x/week      Cognition   Overall Cognitive Status Within Functional Limits for tasks assessed  Posture/Postural Control   Posture/Postural Control Postural limitations    Postural Limitations Rounded Shoulders;Forward head      ROM / Strength   AROM / PROM / Strength AROM;Strength      AROM   Overall AROM Comments Cervical AROM is WNL    AROM Assessment Site Shoulder    Right/Left Shoulder Right;Left    Right Shoulder Extension 69 Degrees    Right Shoulder Flexion 151 Degrees    Right Shoulder ABduction 160 Degrees    Right Shoulder Internal Rotation 62 Degrees    Right Shoulder External Rotation 87 Degrees    Left Shoulder Extension 58 Degrees    Left Shoulder Flexion 149 Degrees    Left Shoulder ABduction 166 Degrees    Left Shoulder Internal Rotation 70 Degrees    Left Shoulder External Rotation 84 Degrees      Strength   Overall Strength Within functional limits for tasks performed               LYMPHEDEMA/ONCOLOGY QUESTIONNAIRE - 02/11/21 0001       Type   Cancer Type  Right breast cancer      Lymphedema Assessments   Lymphedema Assessments Upper extremities      Right Upper Extremity Lymphedema   10 cm Proximal to Olecranon Process 28.9 cm    Olecranon Process 25.8 cm    10 cm Proximal to Ulnar Styloid Process 21.8 cm    Just Proximal to Ulnar Styloid Process 16.3 cm    Across Hand at [employer] 20.7 cm    At [place] of 2nd Digit 6.5 cm      Left Upper Extremity Lymphedema   10 cm Proximal to Olecranon Process 28.9 cm    Olecranon Process 25 cm    10 cm Proximal to Ulnar Styloid Process 21 cm    Just Proximal to Ulnar Styloid Process 15.7 cm    Across Hand at [employer] 20.3 cm    At [place] of 2nd Digit 6.3 cm             L-DEX FLOWSHEETS - 02/11/21 1000       L-DEX LYMPHEDEMA SCREENING   Measurement Type Unilateral    L-DEX MEASUREMENT EXTREMITY Upper Extremity    POSITION  Standing    DOMINANT SIDE Right    At Risk Side Right    BASELINE SCORE (UNILATERAL) 1.3             The patient was assessed using the L-Dex machine today to produce a lymphedema index baseline score. The patient will be reassessed on a regular basis (typically every 3 months) to obtain new L-Dex scores. If the score is > 6.5 points away from his/her baseline score indicating onset of subclinical lymphedema, it will be recommended to wear a compression garment for 4 weeks, 12 hours per day and then be reassessed. If the score continues to be > 6.5 points from baseline at reassessment, we will initiate lymphedema treatment. Assessing in this manner has a 95% rate of preventing clinically significant lymphedema.      Katina Dung - 02/11/21 0001     Open a tight or new jar No difficulty    Do heavy household chores (wash walls, wash floors) No difficulty    Carry a shopping bag or briefcase No difficulty    Wash your back No difficulty    Use a knife to cut food No difficulty    Recreational activities in which you take  some force or impact through your  arm, shoulder, or hand (golf, hammering, tennis) No difficulty    During the past week, to what extent has your arm, shoulder or hand problem interfered with your normal social activities with family, friends, neighbors, or groups? Not at all    During the past week, to what extent has your arm, shoulder or hand problem limited your work or other regular daily activities Not at all    Arm, shoulder, or hand pain. None    Tingling (pins and needles) in your arm, shoulder, or hand None    Difficulty Sleeping No difficulty    DASH Score 0 %              Objective measurements completed on examination: See above findings.     Patient was instructed today in a home exercise program today for post op shoulder range of motion. These included active assist shoulder flexion in sitting, scapular retraction, wall walking with shoulder abduction, and hands behind head external rotation.  She was encouraged to do these twice a day, holding 3 seconds and repeating 5 times when permitted by her physician.             PT Education - 02/11/21 1018     Education Details Lymphedema education and post op HEP    Person(s) Educated Patient;Spouse    Methods Explanation;Demonstration;Handout    Comprehension Returned demonstration;Verbalized understanding                 PT Long Term Goals - 02/11/21 1048       PT LONG TERM GOAL #1   Title Patient will demonstrate she has regained full shoulder ROM and function post operatively compared to baselines.    Time 8    Period Weeks    Status New    Target Date 04/08/21             Breast Clinic Goals - 02/11/21 1048       Patient will be able to verbalize understanding of pertinent lymphedema risk reduction practices relevant to her diagnosis specifically related to skin care.   Time 1    Period Days    Status Achieved      Patient will be able to return demonstrate and/or verbalize understanding of the post-op home exercise  program related to regaining shoulder range of motion.   Time 1    Period Days    Status Achieved      Patient will be able to verbalize understanding of the importance of attending the postoperative After Breast Cancer Class for further lymphedema risk reduction education and therapeutic exercise.   Time 1    Period Days    Status Achieved                   Plan - 02/11/21 1019     Clinical Impression Statement Patient was diagnosed on 01/21/2021 with right grade II invasive ductal carcinoma breast cancer. It measures 1.4 cm in the upper inner quadrant with an 8 cm area of calcifications distortion which will be biopsied following an MRI on 02/13/2021. It is ER/PR positive and HER2 negative with a Ki67 of 5%. Her multidisciplinary medical team met prior to her assessments to determine a recommended treatment plan. She is planning to have a right lumpectomy and sentinel node biopsy followed by Oncotype testing, possible radiation, and anti-estrogen therapy. She will benefit from a post op PT reassessment to determine needs and from L-Dex  screens every 3 months for 2 years to detect subclinical lymphedema.    Stability/Clinical Decision Making Stable/Uncomplicated    Clinical Decision Making Low    Rehab Potential Excellent    PT Frequency --   Eval and 1 f/u visit   PT Treatment/Interventions ADLs/Self Care Home Management;Therapeutic exercise;Patient/family education    PT Next Visit Plan Will reassess 3-4 weeks post op    PT Home Exercise Plan Post op HEP    Consulted and Agree with Plan of Care Patient;Family member/caregiver    Family Member Consulted Husband             Patient will benefit from skilled therapeutic intervention in order to improve the following deficits and impairments:  Postural dysfunction, Decreased range of motion, Decreased knowledge of precautions, Impaired UE functional use, Pain  Visit Diagnosis: Malignant neoplasm of upper-inner quadrant of  right breast in female, estrogen receptor positive ([facility]) - Plan: PT plan of care cert/re-cert  Abnormal posture - Plan: PT plan of care cert/re-cert  Patient will follow up at outpatient cancer rehab 3-4 weeks following surgery.  If the patient requires physical therapy at that time, a specific plan will be dictated and sent to the referring physician for approval. The patient was educated today on appropriate basic range of motion exercises to begin post operatively and the importance of attending the After Breast Cancer class following surgery.  Patient was educated today on lymphedema risk reduction practices as it pertains to recommendations that will benefit the patient immediately following surgery.  She verbalized good understanding.      Problem List Patient Active Problem List   Diagnosis Date Noted   Malignant neoplasm of upper-inner quadrant of right breast in female, estrogen receptor positive ([facility]) 02/06/2021   Cholelithiasis with cholecystitis 06/13/2012   Annia Friendly, PT 02/11/21 10:55 AM   [facility] @ [facility] [place], [place], [address] Phone: [phone]   Fax:  [phone]  Name: Janet Snyder MRN: [id number] Date of Birth: [birth year]

## 2021-02-11 NOTE — Patient Instructions (Signed)

## 2021-02-11 NOTE — Progress Notes (Signed)
[facility]  Initial Assessment   Janet Snyder is a 45 y.o. year old female accompanied by husband. Clinical Social Work was referred by [facility] for assessment of psychosocial needs.   SDOH (Social Determinants of Health) assessments performed: Yes SDOH Interventions    Flowsheet Row Most Recent Value  SDOH Interventions   Food Insecurity Interventions Intervention Not Indicated  Financial Strain Interventions Intervention Not Indicated  Housing Interventions Intervention Not Indicated  Transportation Interventions Intervention Not Indicated       Distress Screen completed: Yes ONCBCN DISTRESS SCREENING 02/11/2021  Screening Type Initial Screening  Distress experienced in past week (1-10) 4      Family/Social Information:  Housing Arrangement: patient lives with husband Forrest  , their 3 children age 61, 30, & 62, and patient's mother. Family members/support persons in your life? Family, Friends/Colleagues, Church, and Counsellor.  Transportation concerns: no  Employment: Working full time as a [employer] manager. Supervisor knows about diagnosis and is supportive/ flexible re lost days for treatment.  Income source: Employment Financial concerns: No Type of concern: None Food access concerns: no Religious or spiritual practice: yes, attends church Medication Concerns: no  Services Currently in place:  pt is active in grief counseling group for the loss of her father last year.  Coping/ Adjustment to diagnosis: Patient understands treatment plan and what happens next? yes Concerns about diagnosis and/or treatment: I'm not especially worried about anything Patient reported stressors: Adjusting to my illness Hopes and priorities: "student loan forgiveness and winning the power ball" (pt has great sense of humor!). "Getting through the process, whatever it may be" and being of service to others on the other side/ using this experience to  support others going through cancer treatment in the future. Patient enjoys  "all types of recreation" and being active with the whole family, including baseball, going to the park, community events downtown, etc. Current coping skills/ strengths: Ability for insight , Active sense of humor , Motivation for treatment/growth , Religious Affiliation , and Supportive family/friends . Patient also uses her desire to be of service to others as additional motivation.     SUMMARY: Current SDOH Barriers:  None identified at this time.   Interventions: Discussed common feeling and emotions when being diagnosed with cancer, and the importance of support during treatment Informed patient of the support team roles and support services at [facility] Provided CSW contact information and encouraged patient to call with any questions or concerns Provided patient with information about [facility], as they have not yet spoken to their children about diagnosis. Pt will follow up if she wants additional resources.    Follow Up Plan:  SW Intern will follow up with patient by phone. Patient verbalizes understanding of plan: Yes   Rosary Lively, Social Work Intern Supervised by [facility] , LCSW

## 2021-02-11 NOTE — Progress Notes (Signed)
[place]   [phone] Fax:([phone] [place] Note   Patient Care Team: Pcp, No as PCP - General Mauro Kaufmann, RN as Oncology Nurse Navigator Rockwell Germany, RN as Oncology Nurse Navigator Rolm Bookbinder, MD as Consulting Physician (General Surgery) Truitt Merle, MD as Consulting Physician (Hematology) Gery Pray, MD as Consulting Physician (Radiation Oncology)  Date of Service:  02/11/2021   CHIEF COMPLAINTS/PURPOSE OF CONSULTATION:  Right Breast Cancer, ER+  REFERRING PHYSICIAN:  [facility]   ASSESSMENT & PLAN:  Janet Snyder is a 45 y.o. premenopausal female with   1. Malignant neoplasm of upper-inner quadrant of right breast, Stage IA, c(T1c, N0), ER+/PR+/HER2-, Grade 2  -found on screening mammogram. Right diagnostic MM and [place] on 01/29/21 showed 1.4 cm right breast mass at 1 o'clock with distortion and calcifications (total 8cm area). 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 --We discussed her imaging findings and the biopsy results in great details. -She was seen by Dr. Donne Hazel today. She is currently scheduled for breast MRI on 02/13/21 to further determine the extent of her disease. She discussed this and additional biopsies of the calcifications with Dr. Donne Hazel. -I recommend a Oncotype Dx test on the surgical sample and we'll make a decision about adjuvant chemotherapy based on the Oncotype result. Written material of this test was given to her. She is young and fit, would be a good candidate for chemotherapy if her Oncotype recurrence score is high. -If her surgical sentinel lymph node is positive, I recommend mammaprint for further risk stratification and guide adjuvant chemotherapy. -Giving the strong ER and PR expression in her postmenopausal status, I recommend adjuvant endocrine therapy with aromatase inhibitor for a total of 5-10 years to reduce the risk of cancer recurrence. Potential benefits and  side effects were discussed with patient and she is interested. -She was also seen by radiation oncologist Dr. Sondra Come today. She will likely benefit from breast radiation if she undergo lumpectomy to decrease the risk of breast cancer. -We also discussed the breast cancer surveillance after her surgery. She will continue annual screening mammogram, self exam, and a routine office visit with lab and exam with [place]. -I encouraged her to have healthy diet and exercise regularly.   2. Thrombocytopenia -incidentally noted, mild  -slow decline in platelet count since 2013, down to 117k today (02/11/21). We will monitor. -possible ITP    PLAN:  -proceed with breast MRI and/or additional biopsies per Dr. Donne Hazel, then proceed with breast surgery -Oncotype on surgical sample -f/u after radiation or surgery     Oncology History Overview Note  Cancer Staging Malignant neoplasm of upper-inner quadrant of right breast in female, estrogen receptor positive ([place]) Staging form: Breast, AJCC 8th Edition - Clinical stage from 02/03/2021: Stage IA (cT1c, cN0, cM0, G2, ER+, PR+, HER2-) - Signed by Truitt Merle, MD on 02/10/2021    Malignant neoplasm of upper-inner quadrant of right breast in female, estrogen receptor positive ([place])  01/29/2021 Mammogram   EXAM: DIGITAL DIAGNOSTIC UNILATERAL RIGHT MAMMOGRAM WITH TOMOSYNTHESIS AND CAD; ULTRASOUND RIGHT BREAST LIMITED  IMPRESSION: Highly suspicious right breast mass measuring 13 x 14 x 12 mm at 1 o'clock with associated distortion. There are calcifications in the mass. Calcifications are seen anterior and posterior to the mass as well.   02/03/2021 Cancer Staging   Staging form: Breast, AJCC 8th Edition - Clinical stage from 02/03/2021: Stage IA (cT1c, cN0, cM0, G2, ER+, PR+, HER2-) - Signed by  Truitt Merle, MD on 02/10/2021 Stage prefix: Initial diagnosis Histologic grading system: 3 grade system    02/03/2021 Pathology Results   Diagnosis Breast, right,  needle core biopsy, 1 o'clock mass, ribbon clip - INVASIVE MAMMARY CARCINOMA - MAMMARY CARCINOMA IN SITU - SEE COMMENT Microscopic Comment The biopsy material shows an infiltrative proliferation of cells arranged linearly and in small clusters. Based on the biopsy, the carcinoma appears Nottingham grade 2 of 3 and measures 1.1 cm in greatest linear extent.  E-cadherin is POSITIVE supporting a ductal origin.  PROGNOSTIC INDICATORS Results: The tumor cells are EQUIVOCAL for Her2 (2+). Her2 by FISH will be performed and results reported separately. Estrogen Receptor: 100%, POSITIVE, STRONG STAINING INTENSITY Progesterone Receptor: 100%, POSITIVE, STRONG STAINING INTENSITY Proliferation Marker Ki67: 5%  FLUORESCENCE IN-SITU HYBRIDIZATION Results: GROUP 5: HER2 **NEGATIVE**   02/06/2021 Initial Diagnosis   Malignant neoplasm of upper-inner quadrant of right breast in female, estrogen receptor positive ([place])      HISTORY OF PRESENTING ILLNESS:  Janet Snyder 45 y.o. female is a here because of breast cancer. The patient was [facility] by [facility]. The patient presents to the clinic today accompanied by her husband.   She had routine screening mammography on 01/21/21 showing a possible abnormality in the right breast. She underwent right diagnostic mammography and right breast ultrasonography on 01/29/21 showing: 14 mm right breast mass at 1 o'clock with associated distortion and calcifications.  Biopsy on 02/03/21 showed: invasive ductal carcinoma, grade 2; DCIS. Prognostic indicators significant for: estrogen receptor, 100% positive and progesterone receptor, 100% positive. Proliferation marker Ki67 at 5%. HER2 negative by FISH.   Today the patient notes they felt/feeling prior/after...   She has a PMHx of.... -s/p cholecystectomy 2014   Socially... -she works as a [employer] manager -she is married with three children, aged 104, 51, and 79. -two paternal  great-aunts with cancer, one with breast and one with blood.   GYN HISTORY  Menarchal: 16-79 years old LMP: 01/23/21, regular periods Contraceptive: used 2004-2006 HRT: n/a GP: 3, first at age 43   REVIEW OF SYSTEMS:   Constitutional: Denies fevers, chills or abnormal night sweats Eyes: Denies blurriness of vision, double vision or watery eyes Ears, nose, mouth, throat, and face: Denies mucositis or sore throat Respiratory: Denies cough, dyspnea or wheezes Cardiovascular: Denies palpitation, chest discomfort or lower extremity swelling Gastrointestinal:  Denies nausea, heartburn or change in bowel habits Skin: Denies abnormal skin rashes Lymphatics: Denies new lymphadenopathy or easy bruising Neurological:Denies numbness, tingling or new weaknesses Behavioral/Psych: Mood is stable, no new changes  All other systems were reviewed with the patient and are negative.   MEDICAL HISTORY:  Past Medical History:  Diagnosis Date   Orthopnea    PNA (pneumonia)    SOB (shortness of breath)     SURGICAL HISTORY: Past Surgical History:  Procedure Laterality Date   CESAREAN SECTION     CHOLECYSTECTOMY N/A 06/13/2012   Procedure: LAPAROSCOPIC CHOLECYSTECTOMY WITH INTRAOPERATIVE CHOLANGIOGRAM;  Surgeon: Earnstine Regal, MD;  Location: [facility];  Service: General;  Laterality: N/A;   FOOT SURGERY      SOCIAL HISTORY: Social History   Socioeconomic History   Marital status: Married    Spouse name: Not on file   Number of children: 3   Years of education: Not on file   Highest education level: Not on file  Occupational History   Not on file  Tobacco Use   Smoking status: Never   Smokeless tobacco:  Never  Substance and Sexual Activity   Alcohol use: No   Drug use: No   Sexual activity: Not Currently  Other Topics Concern   Not on file  Social History Narrative   Not on file   Social Determinants of Health   Financial Resource Strain: Low Risk    Difficulty of Paying Living  Expenses: Not very hard  Food Insecurity: No Food Insecurity   Worried About Running Out of Food in the Last Year: Never true   Ran Out of Food in the Last Year: Never true  Transportation Needs: No Transportation Needs   Lack of Transportation (Medical): No   Lack of Transportation (Non-Medical): No  Physical Activity: Not on file  Stress: Not on file  Social Connections: Not on file  Intimate Partner Violence: Not on file    FAMILY HISTORY: Family History  Problem Relation Age of Onset   Hypertension Father    Heart failure Father        cva   Heart attack Father    Cancer Other        breast cancer   Cancer Other        blood cancer    ALLERGIES:  is allergic to sulfonamide derivatives.  MEDICATIONS:  Current Outpatient Medications  Medication Sig Dispense Refill   Multiple Vitamins-Minerals (MULTIVITAMIN WITH MINERALS) tablet Take 1 tablet by mouth daily.     vitamin E 1000 UNIT capsule Take 1,000 Units by mouth daily.     naproxen sodium (ANAPROX) 220 MG tablet Take 220 mg by mouth daily as needed. (Patient not taking: Reported on 02/11/2021)     No current facility-administered medications for this visit.    PHYSICAL EXAMINATION: ECOG PERFORMANCE STATUS: 0 - Asymptomatic  Vitals:   02/11/21 0902  BP: 110/64  Pulse: 64  Resp: 18  Temp: 98.1 F (36.7 C)  SpO2: 100%   Filed Weights   02/11/21 0902  Weight: 161 lb 14.4 oz (73.4 kg)    GENERAL:alert, no distress and comfortable SKIN: skin color, texture, turgor are normal, no rashes or significant lesions EYES: normal, Conjunctiva are pink and non-injected, sclera clear  NECK: supple, thyroid normal size, non-tender, without nodularity LYMPH:  no palpable lymphadenopathy in the cervical, axillary  LUNGS: clear to auscultation and percussion with normal breathing effort HEART: regular rate & rhythm and no murmurs and no lower extremity edema ABDOMEN:abdomen soft, non-tender and normal bowel  sounds Musculoskeletal:no cyanosis of digits and no clubbing  NEURO: alert & oriented x 3 with fluent speech, no focal motor/sensory deficits BREAST: No palpable mass, nodules or adenopathy bilaterally.   LABORATORY DATA:  I have reviewed the data as listed CBC Latest Ref Rng & Units 02/11/2021 06/13/2012 01/18/2012  WBC 4.0 - 10.5 K/uL 4.7 9.7 10.7(H)  Hemoglobin 12.0 - 15.0 g/dL 12.0 12.2 11.9(L)  Hematocrit 36.0 - 46.0 % 35.2(L) 36.3 35.9(L)  Platelets 150 - 400 K/uL 117(L) 134(L) 146(L)    CMP Latest Ref Rng & Units 02/11/2021 06/13/2012 01/18/2012  Glucose 70 - 99 mg/dL 84 111(H) 120(H)  BUN 6 - 20 mg/dL _0 Creatinine 0.44 - 1.00 mg/dL 0.85 0.80 0.80  Sodium 135 - 145 mmol/L 140 137 136  Potassium 3.5 - 5.1 mmol/L 4.1 3.4(L) 3.5  Chloride 98 - 111 mmol/L 110 104 100  CO2 22 - 32 mmol/L _1 Calcium 8.9 - 10.3 mg/dL 8.9 9.4 8.9  Total Protein 6.5 - 8.1 g/dL 6.9 7.4 7.5  Total Bilirubin 0.3 - 1.2 mg/dL 0.5 0.3 0.3  Alkaline Phos 38 - 126 U/L 40 49 52  AST 15 - 41 U/L 14(L) 14 15  ALT 0 - 44 U/L _0 RADIOGRAPHIC STUDIES: I have personally reviewed the radiological images as listed and agreed with the findings in the report. US BREAST LTD UNI RIGHT INC AXILLA  Result Date: 01/29/2021 CLINICAL DATA:  The patient was called back from screening mammography due to possible distortion in the right breast. EXAM: DIGITAL DIAGNOSTIC UNILATERAL RIGHT MAMMOGRAM WITH TOMOSYNTHESIS AND CAD; ULTRASOUND RIGHT BREAST LIMITED TECHNIQUE: Right digital diagnostic mammography and breast tomosynthesis was performed. The images were evaluated with computer-aided detection.; Targeted ultrasound examination of the right breast was performed COMPARISON:  Previous exam(s). ACR Breast Density Category c: The breast tissue is heterogeneously dense, which may obscure small masses. FINDINGS: There is persistent distortion in the upper inner right breast at a mid to posterior depth. There are  calcifications within the region of distortion. On magnified views, there are calcifications extending anterior and posterior to the distortion. Including the calcifications anterior and posterior to the distortion, the findings span 8 cm. Targeted ultrasound is performed, showing a suspicious mass containing at least 1 calcification in the right breast at 1 o'clock, 2 cm from the nipple measuring 13 x 14 x 12 mm. No axillary adenopathy. IMPRESSION: Highly suspicious right breast mass at 1 o'clock with associated distortion. There are calcifications in the mass. Calcifications are seen anterior and posterior to the mass as well. RECOMMENDATION: Recommend ultrasound-guided biopsy of the 1 o'clock right breast mass. Assuming the biopsy demonstrates malignancy, recommend breast MRI to assess extent of disease if lumpectomy is being considered. After breast MRI, the patient will need additional biopsies to prove extent of disease if lumpectomy is being considered. The additional biopsies could be based on MRI findings or the calcifications anterior and posterior to the mass could be biopsied. I have discussed the findings and recommendations with the patient. If applicable, a reminder letter will be sent to the patient regarding the next appointment. BI-RADS CATEGORY  5: Highly suggestive of malignancy. Electronically Signed   By: Dorise Bullion III M.D.   On: 01/29/2021 16:00  MM DIAG BREAST TOMO UNI RIGHT  Result Date: 01/29/2021 CLINICAL DATA:  The patient was called back from screening mammography due to possible distortion in the right breast. EXAM: DIGITAL DIAGNOSTIC UNILATERAL RIGHT MAMMOGRAM WITH TOMOSYNTHESIS AND CAD; ULTRASOUND RIGHT BREAST LIMITED TECHNIQUE: Right digital diagnostic mammography and breast tomosynthesis was performed. The images were evaluated with computer-aided detection.; Targeted ultrasound examination of the right breast was performed COMPARISON:  Previous exam(s). ACR Breast Density  Category c: The breast tissue is heterogeneously dense, which may obscure small masses. FINDINGS: There is persistent distortion in the upper inner right breast at a mid to posterior depth. There are calcifications within the region of distortion. On magnified views, there are calcifications extending anterior and posterior to the distortion. Including the calcifications anterior and posterior to the distortion, the findings span 8 cm. Targeted ultrasound is performed, showing a suspicious mass containing at least 1 calcification in the right breast at 1 o'clock, 2 cm from the nipple measuring 13 x 14 x 12 mm. No axillary adenopathy. IMPRESSION: Highly suspicious right breast mass at 1 o'clock with associated distortion. There are calcifications in the mass. Calcifications are seen anterior and posterior to the mass as well. RECOMMENDATION: Recommend ultrasound-guided biopsy of the 1 o'clock right  breast mass. Assuming the biopsy demonstrates malignancy, recommend breast MRI to assess extent of disease if lumpectomy is being considered. After breast MRI, the patient will need additional biopsies to prove extent of disease if lumpectomy is being considered. The additional biopsies could be based on MRI findings or the calcifications anterior and posterior to the mass could be biopsied. I have discussed the findings and recommendations with the patient. If applicable, a reminder letter will be sent to the patient regarding the next appointment. BI-RADS CATEGORY  5: Highly suggestive of malignancy. Electronically Signed   By: Dorise Bullion III M.D.   On: 01/29/2021 16:00  MM 3D SCREEN BREAST BILATERAL  Result Date: 01/23/2021 CLINICAL DATA:  Screening. EXAM: DIGITAL SCREENING BILATERAL MAMMOGRAM WITH TOMOSYNTHESIS AND CAD TECHNIQUE: Bilateral screening digital craniocaudal and mediolateral oblique mammograms were obtained. Bilateral screening digital breast tomosynthesis was performed. The images were evaluated  with computer-aided detection. COMPARISON:  Previous exam(s). ACR Breast Density Category c: The breast tissue is heterogeneously dense, which may obscure small masses. FINDINGS: In the right breast, possible distortion warrants further evaluation. In the left breast, no findings suspicious for malignancy. IMPRESSION: Further evaluation is suggested for possible distortion in the right breast. RECOMMENDATION: Diagnostic mammogram and possibly ultrasound of the right breast. (Code:[id number]-R-100M) The patient will be contacted regarding the findings, and additional imaging will be scheduled. BI-RADS CATEGORY  0: Incomplete. Need additional imaging evaluation and/or prior mammograms for comparison. Electronically Signed   By: Nolon Nations M.D.   On: 01/23/2021 12:07   MM CLIP PLACEMENT RIGHT  Result Date: 02/03/2021 CLINICAL DATA:  Status post ultrasound-guided biopsy of a RIGHT breast mass. EXAM: 3D DIAGNOSTIC RIGHT MAMMOGRAM POST ULTRASOUND BIOPSY COMPARISON:  Previous exam(s). FINDINGS: 3D Mammographic images were obtained following ultrasound guided biopsy of the RIGHT breast mass at the 1 o'clock axis. The biopsy marking clip is in expected position at the site of biopsy. IMPRESSION: Appropriate positioning of the ribbon shaped biopsy marking clip at the site of biopsy in the upper inner quadrant of the RIGHT breast corresponding to the targeted mass at the 1 o'clock axis. Final Assessment: Post Procedure Mammograms for Marker Placement Electronically Signed   By: Franki Cabot M.D.   On: 02/03/2021 14:30  [place] RT BREAST BX W LOC DEV 1ST LESION IMG BX SPEC US GUIDE  Addendum Date: 02/04/2021   ADDENDUM REPORT: 02/04/2021 13:25 ADDENDUM: Pathology revealed GRADE II INVASIVE MAMMARY CARCINOMA, MAMMARY CARCINOMA IN SITU of the RIGHT breast, 1 o'clock, (ribbon clip). This was found to be concordant by Dr. Franki Cabot. Pathology results were discussed with the patient by telephone by Stacie Acres, RN Nurse  Navigator. The patient reported doing well after the biopsy with tenderness at the site. Post biopsy instructions and care were reviewed and questions were answered. The patient was encouraged to call [facility] for any additional concerns. The patient was referred to [facility] on February 11, 2021. ** Recommendation for MRI to assess extent of disease if breast conservation is being considered. After breast MRI, the patient will need additional biopsies to prove extent of disease if breast conservation is being considered. The additional biopsies could be based on MRI findings or the calcifications anterior and posterior to the mass could be biopsied. Pathology results reported by Terie Purser, RN on 02/04/2021. Electronically Signed   By: Franki Cabot M.D.   On: 02/04/2021 13:25   Result Date: 02/04/2021  CLINICAL DATA:  Patient with a RIGHT breast mass with associated distortion presents today for ultrasound-guided core biopsy. EXAM: ULTRASOUND GUIDED RIGHT BREAST CORE NEEDLE BIOPSY COMPARISON:  Previous exam(s). PROCEDURE: I met with the patient and we discussed the procedure of ultrasound-guided biopsy, including benefits and alternatives. We discussed the high likelihood of a successful procedure. We discussed the risks of the procedure, including infection, bleeding, tissue injury, clip migration, and inadequate sampling. Informed written consent was given. The usual time-out protocol was performed immediately prior to the procedure. Lesion quadrant: Upper outer quadrant Using sterile technique and 1% Lidocaine as local anesthetic, under direct ultrasound visualization, a 12 gauge spring-loaded device was used to perform biopsy of the RIGHT breast mass at the 1 o'clock axis using a inferomedial approach. At the conclusion of the procedure ribbon shaped tissue marker clip was deployed into the biopsy  cavity. Follow up 2 view mammogram was performed and dictated separately. IMPRESSION: Ultrasound guided biopsy of the RIGHT breast mass at the 1 o'clock axis. No apparent complications. Electronically Signed: By: Franki Cabot M.D. On: 02/03/2021 14:13    No orders of the defined types were placed in this encounter.   All questions were answered. The patient knows to call the clinic with any problems, questions or concerns. The total time spent in the appointment was 45 minutes.     Truitt Merle, MD 02/11/2021 3:12 PM  I, Wilburn Mylar, am acting as scribe for Truitt Merle, MD.   I have reviewed the above documentation for accuracy and completeness, and I agree with the above.

## 2021-02-12 DIAGNOSIS — C801 Malignant (primary) neoplasm, unspecified: Secondary | ICD-10-CM

## 2021-02-12 HISTORY — DX: Malignant (primary) neoplasm, unspecified: C80.1

## 2021-02-13 ENCOUNTER — Encounter: Payer: Self-pay | Admitting: Genetic Counselor

## 2021-02-13 ENCOUNTER — Ambulatory Visit
Admission: RE | Admit: 2021-02-13 | Discharge: 2021-02-13 | Disposition: A | Discharge status: Home / Self Care | Payer: Managed Care, Other (non HMO) | Source: Ambulatory Visit | Attending: Obstetrics and Gynecology | Admitting: Obstetrics and Gynecology

## 2021-02-13 ENCOUNTER — Other Ambulatory Visit: Payer: Self-pay

## 2021-02-13 DIAGNOSIS — C50911 Malignant neoplasm of unspecified site of right female breast: Secondary | ICD-10-CM

## 2021-02-13 DIAGNOSIS — Z803 Family history of malignant neoplasm of breast: Secondary | ICD-10-CM

## 2021-02-13 HISTORY — DX: Family history of malignant neoplasm of breast: Z80.3

## 2021-02-13 MED ORDER — GADOBUTROL 1 MMOL/ML IV SOLN
7.0000 mL | Freq: Once | INTRAVENOUS | Status: AC | PRN
  Administered 2021-02-13: 7 mL via INTRAVENOUS

## 2021-02-13 NOTE — Progress Notes (Signed)
REFERRING PROVIDER: Truitt Merle, MD [place],  [place] [address]  PRIMARY PROVIDER:  None listed  PRIMARY REASON FOR VISIT:  1. Malignant neoplasm of upper-inner quadrant of right breast in female, estrogen receptor positive ([place])   2. Family history of breast cancer     HISTORY OF PRESENT ILLNESS:   Janet Snyder, a 45 y.o. female, was seen for a [facility] cancer genetics consultation during the breast multidisciplinary clinic at the request of Dr. Burr Medico due to a personal history of breast.  Janet Snyder presents to clinic today to discuss the possibility of a hereditary predisposition to cancer, to discuss genetic testing, and to further clarify her future cancer risks, as well as potential cancer risks for family members.   In November 2022, at the age of 87, Janet Snyder was diagnosed with invasive ductal carcinoma of the right breast (ER+/PR+/HER2-). The treatment plan is pending.   CANCER HISTORY:  Oncology History Overview Note  Cancer Staging Malignant neoplasm of upper-inner quadrant of right breast in female, estrogen receptor positive ([place]) Staging form: Breast, AJCC 8th Edition - Clinical stage from 02/03/2021: Stage IA (cT1c, cN0, cM0, G2, ER+, PR+, HER2-) - Signed by Truitt Merle, MD on 02/10/2021    Malignant neoplasm of upper-inner quadrant of right breast in female, estrogen receptor positive ([place])  01/29/2021 Mammogram   EXAM: DIGITAL DIAGNOSTIC UNILATERAL RIGHT MAMMOGRAM WITH TOMOSYNTHESIS AND CAD; ULTRASOUND RIGHT BREAST LIMITED  IMPRESSION: Highly suspicious right breast mass measuring 13 x 14 x 12 mm at 1 o'clock with associated distortion. There are calcifications in the mass. Calcifications are seen anterior and posterior to the mass as well.   02/03/2021 Cancer Staging   Staging form: Breast, AJCC 8th Edition - Clinical stage from 02/03/2021: Stage IA (cT1c, cN0, cM0, G2, ER+, PR+, HER2-) - Signed by Truitt Merle, MD on 02/10/2021 Stage prefix: Initial  diagnosis Histologic grading system: 3 grade system    02/03/2021 Pathology Results   Diagnosis Breast, right, needle core biopsy, 1 o'clock mass, ribbon clip - INVASIVE MAMMARY CARCINOMA - MAMMARY CARCINOMA IN SITU - SEE COMMENT Microscopic Comment The biopsy material shows an infiltrative proliferation of cells arranged linearly and in small clusters. Based on the biopsy, the carcinoma appears Nottingham grade 2 of 3 and measures 1.1 cm in greatest linear extent.  E-cadherin is POSITIVE supporting a ductal origin.  PROGNOSTIC INDICATORS Results: The tumor cells are EQUIVOCAL for Her2 (2+). Her2 by FISH will be performed and results reported separately. Estrogen Receptor: 100%, POSITIVE, STRONG STAINING INTENSITY Progesterone Receptor: 100%, POSITIVE, STRONG STAINING INTENSITY Proliferation Marker Ki67: 5%  FLUORESCENCE IN-SITU HYBRIDIZATION Results: GROUP 5: HER2 **NEGATIVE**   02/06/2021 Initial Diagnosis   Malignant neoplasm of upper-inner quadrant of right breast in female, estrogen receptor positive ([place])     RISK FACTORS:  Menarche was at age 32.  First live birth at age 76.  OCP use for approximately 2 years.  Ovaries intact: yes.  Hysterectomy: no.  Menopausal status: premenopausal.  HRT use: 0 years. Colonoscopy: yes;  approximately 7 years ago . Mammogram within the last year: yes. Up to date with pelvic exams: most recent PAP in 2021.   Past Medical History:  Diagnosis Date   Family history of breast cancer 02/13/2021   Orthopnea    PNA (pneumonia)    SOB (shortness of breath)     Past Surgical History:  Procedure Laterality Date   CESAREAN SECTION     CHOLECYSTECTOMY N/A 06/13/2012   Procedure: LAPAROSCOPIC  CHOLECYSTECTOMY WITH INTRAOPERATIVE CHOLANGIOGRAM;  Surgeon: Todd M Gerkin, MD;  Location: [facility];  Service: General;  Laterality: N/A;   FOOT SURGERY        FAMILY HISTORY:  We obtained a detailed, 4-generation family history.   Significant diagnoses are listed below: Family History  Problem Relation Age of Onset   Lung cancer Paternal Grandfather        d. 60s; smoking hx   Breast cancer Other        PGM's sister   Leukemia Other        PGM's sister     Janet Snyder is unaware of previous family history of genetic testing for hereditary cancer risks. There is no reported Ashkenazi Jewish ancestry. There is no known consanguinity.  GENETIC COUNSELING ASSESSMENT: Janet Snyder is a 45 y.o. female with a personal history of cancer which is somewhat suggestive of a hereditary cancer syndrome and predisposition to cancer given her age of diagnosis. We, therefore, discussed and recommended the following at today's visit.   DISCUSSION: We discussed that 5 - 10% of cancer is hereditary, with most cases of hereditary breast cancer associated with mutations in BRCA1/2.  There are other genes that can be associated with hereditary breast cancer syndromes.  Type of cancer risk and level of risk are gene-specific. We discussed that testing is beneficial for several reasons including knowing how to follow individuals after completing their treatment, identifying whether potential treatment options would be beneficial, and understanding if other family members could be at risk for cancer and allowing them to undergo genetic testing.   We reviewed the characteristics, features and inheritance patterns of hereditary cancer syndromes. We also discussed genetic testing, including the appropriate family members to test, the process of testing, insurance coverage and turn-around-time for results. We discussed the implications of a negative, positive and/or variant of uncertain significant result. In order to get genetic test results in a timely manner so that Janet Snyder can use these genetic test results for surgical decisions, we recommended Janet Snyder pursue genetic testing for the [facility] BRCAPlus Panel.  The BRCAplus panel offered by [facility] and includes sequencing and deletion/duplication analysis for the following 8 genes: ATM, BRCA1, BRCA2, CDH1, CHEK2, PALB2, PTEN, and TP53. Once complete, we recommend Janet Snyder pursue reflex genetic testing to a more comprehensive gene panel.   Janet Snyder  was offered a common hereditary cancer panel (47 genes) and an expanded pan-cancer panel (77 genes). Janet Snyder was informed of the benefits and limitations of each panel, including that expanded pan-cancer panels contain genes that do not have clear management guidelines at this point in time.  We also discussed that as the number of genes included on a panel increases, the chances of variants of uncertain significance increases.  After considering the benefits and limitations of each gene panel, Janet Snyder  elected to have a common hereditary cancers panel through [facility].  The CustomNext-Cancer+RNAinsight panel offered by [facility] includes sequencing and rearrangement analysis for the following 47 genes:  APC, ATM, AXIN2, BARD1, BMPR1A, BRCA1, BRCA2, BRIP1, CDH1, CDK4, CDKN2A, CHEK2, DICER1, EPCAM, GREM1, HOXB13, MEN1, MLH1, MSH2, MSH3, MSH6, MUTYH, NBN, NF1, NF2, NTHL1, PALB2, PMS2, POLD1, POLE, PTEN, RAD51C, RAD51D, RECQL, RET, SDHA, SDHAF2, SDHB, SDHC, SDHD, SMAD4, SMARCA4, STK11, TP53, TSC1, TSC2, and VHL.  RNA data is routinely analyzed for use in variant interpretation for all genes.  Based on Janet Snyder's personal history of cancer, she meets medical criteria for genetic testing. Despite   that she meets criteria, she may still have an out of pocket cost. We discussed that if her out of pocket cost for testing is over $100, the laboratory should contact them to discuss self-pay prices, patient pay assistance programs, if applicable, and other billing options.   PLAN: After considering the risks, benefits, and limitations, Janet Snyder provided informed consent to pursue genetic testing and the blood sample was sent to [facility] for analysis of the BRCAPlus and CustomNext-Cancer +RNAinsight Panel. Results should be available within approximately 1-2 weeks' time, at which point they will be disclosed by telephone to Janet Snyder, as will any additional recommendations warranted by these results. Janet Snyder will receive a summary of her genetic counseling visit and a copy of her results once available. This information will also be available in Epic.   Lastly, we encouraged Janet Snyder to remain in contact with cancer genetics annually so that we can continuously update the family history and inform her of any changes in cancer genetics and testing that may be of benefit for this family.   Janet Snyder's questions were answered to her satisfaction today. Our contact information was provided should additional questions or concerns arise. Thank you for the referral and allowing us to share in the care of your patient.   Janet M. Koerner, MS, LCGC Genetic Counselor [email or web address] (P) [phone]  The patient was seen for a total of 20 minutes in face-to-face genetic counseling.  The patient was accompanied by her husband, Janet Snyder. Drs. Magrinat, Gudena and/or Feng were available to discuss this case as needed.  _______________________________________________________________________ For Office Staff:  Number of people involved in session: 2 Was an Intern/ student involved with case: no  

## 2021-02-16 ENCOUNTER — Other Ambulatory Visit: Payer: Self-pay | Admitting: *Deleted

## 2021-02-16 DIAGNOSIS — C50211 Malignant neoplasm of upper-inner quadrant of right female breast: Secondary | ICD-10-CM

## 2021-02-16 DIAGNOSIS — Z170 Estrogen receptor positive status [ER+]: Secondary | ICD-10-CM

## 2021-02-16 DIAGNOSIS — R928 Other abnormal and inconclusive findings on diagnostic imaging of breast: Secondary | ICD-10-CM

## 2021-02-18 ENCOUNTER — Encounter: Payer: Self-pay | Admitting: *Deleted

## 2021-02-18 ENCOUNTER — Telehealth: Payer: Self-pay | Admitting: *Deleted

## 2021-02-18 NOTE — Telephone Encounter (Signed)
Spoke with patient to follow up with [facility] and assess navigation needs. Patient denies any questions or concerns at this time. Encouraged her to call should anything arise.

## 2021-02-23 ENCOUNTER — Encounter: Payer: Self-pay | Admitting: Genetic Counselor

## 2021-02-23 ENCOUNTER — Ambulatory Visit: Payer: Self-pay | Admitting: Genetic Counselor

## 2021-02-23 ENCOUNTER — Other Ambulatory Visit: Payer: Self-pay

## 2021-02-23 ENCOUNTER — Telehealth: Payer: Self-pay | Admitting: Genetic Counselor

## 2021-02-23 ENCOUNTER — Other Ambulatory Visit (HOSPITAL_COMMUNITY): Payer: Self-pay | Admitting: Diagnostic Radiology

## 2021-02-23 ENCOUNTER — Ambulatory Visit
Admission: RE | Admit: 2021-02-23 | Discharge: 2021-02-23 | Disposition: A | Discharge status: Home / Self Care | Payer: Managed Care, Other (non HMO) | Source: Ambulatory Visit | Attending: General Surgery | Admitting: General Surgery

## 2021-02-23 DIAGNOSIS — Z803 Family history of malignant neoplasm of breast: Secondary | ICD-10-CM

## 2021-02-23 DIAGNOSIS — Z1379 Encounter for other screening for genetic and chromosomal anomalies: Secondary | ICD-10-CM | POA: Insufficient documentation

## 2021-02-23 DIAGNOSIS — Z170 Estrogen receptor positive status [ER+]: Secondary | ICD-10-CM

## 2021-02-23 DIAGNOSIS — R928 Other abnormal and inconclusive findings on diagnostic imaging of breast: Secondary | ICD-10-CM

## 2021-02-23 MED ORDER — GADOBUTROL 1 MMOL/ML IV SOLN
5.0000 mL | Freq: Once | INTRAVENOUS | Status: AC | PRN
  Administered 2021-02-23: 5 mL via INTRAVENOUS

## 2021-02-23 NOTE — Telephone Encounter (Addendum)
Revealed negative genetic testing.  Discussed that we do not know why she has breast cancer or why there is cancer in the family. It could be sporadic/familial, due to a different gene that we are not testing, or maybe our current technology may not be able to pick something up.  It will be important for her to keep in contact with genetics to keep up with whether additional testing may be needed.   

## 2021-02-23 NOTE — Progress Notes (Signed)
HPI:   Janet Snyder was previously seen in the [facility] clinic due to a personal history of breast cancer and concerns regarding a hereditary predisposition to cancer. Please refer to our prior cancer genetics clinic note for more information regarding our discussion, assessment and recommendations, at the time. Ms. Janet Snyder recent genetic test results were disclosed to her, as were recommendations warranted by these results. These results and recommendations are discussed in more detail below.  CANCER HISTORY:  Oncology History Overview Note  Cancer Staging Malignant neoplasm of upper-inner quadrant of right breast in female, estrogen receptor positive ([place]) Staging form: Breast, AJCC 8th Edition - Clinical stage from 02/03/2021: Stage IA (cT1c, cN0, cM0, G2, ER+, PR+, HER2-) - Signed by Truitt Merle, MD on 02/10/2021    Malignant neoplasm of upper-inner quadrant of right breast in female, estrogen receptor positive ([place])  01/29/2021 Mammogram   EXAM: DIGITAL DIAGNOSTIC UNILATERAL RIGHT MAMMOGRAM WITH TOMOSYNTHESIS AND CAD; ULTRASOUND RIGHT BREAST LIMITED  IMPRESSION: Highly suspicious right breast mass measuring 13 x 14 x 12 mm at 1 o'clock with associated distortion. There are calcifications in the mass. Calcifications are seen anterior and posterior to the mass as well.   02/03/2021 Cancer Staging   Staging form: Breast, AJCC 8th Edition - Clinical stage from 02/03/2021: Stage IA (cT1c, cN0, cM0, G2, ER+, PR+, HER2-) - Signed by Truitt Merle, MD on 02/10/2021 Stage prefix: Initial diagnosis Histologic grading system: 3 grade system    02/03/2021 Pathology Results   Diagnosis Breast, right, needle core biopsy, 1 o'clock mass, ribbon clip - INVASIVE MAMMARY CARCINOMA - MAMMARY CARCINOMA IN SITU - SEE COMMENT Microscopic Comment The biopsy material shows an infiltrative proliferation of cells arranged linearly and in small clusters. Based on the biopsy, the carcinoma appears  Nottingham grade 2 of 3 and measures 1.1 cm in greatest linear extent.  E-cadherin is POSITIVE supporting a ductal origin.  PROGNOSTIC INDICATORS Results: The tumor cells are EQUIVOCAL for Her2 (2+). Her2 by FISH will be performed and results reported separately. Estrogen Receptor: 100%, POSITIVE, STRONG STAINING INTENSITY Progesterone Receptor: 100%, POSITIVE, STRONG STAINING INTENSITY Proliferation Marker Ki67: 5%  FLUORESCENCE IN-SITU HYBRIDIZATION Results: GROUP 5: HER2 **NEGATIVE**   02/06/2021 Initial Diagnosis   Malignant neoplasm of upper-inner quadrant of right breast in female, estrogen receptor positive ([place])   02/21/2021 Genetic Testing   Negative hereditary cancer genetic testing: no pathogenic variants detected in [facility] CustomNext-Cancer +RNAinsight Panel.  The report date is February 21, 2021.   The CustomNext-Cancer+RNAinsight panel offered by [facility] includes sequencing and rearrangement analysis for the following 47 genes:  APC, ATM, AXIN2, BARD1, BMPR1A, BRCA1, BRCA2, BRIP1, CDH1, CDK4, CDKN2A, CHEK2, DICER1, EPCAM, GREM1, HOXB13, MEN1, MLH1, MSH2, MSH3, MSH6, MUTYH, NBN, NF1, NF2, NTHL1, PALB2, PMS2, POLD1, POLE, PTEN, RAD51C, RAD51D, RECQL, RET, SDHA, SDHAF2, SDHB, SDHC, SDHD, SMAD4, SMARCA4, STK11, TP53, TSC1, TSC2, and VHL.  RNA data is routinely analyzed for use in variant interpretation for all genes.      FAMILY HISTORY:  We obtained a detailed, 4-generation family history.  Significant diagnoses are listed below: Family History  Problem Relation Age of Onset   Lung cancer Paternal Grandfather        d. 32s; smoking hx   Breast cancer Other        PGM's sister   Leukemia Other        PGM's sister     Ms. Janet Snyder is unaware of previous family history of genetic testing for hereditary cancer risks.  There is no reported Ashkenazi Jewish ancestry. There is no known consanguinity.  GENETIC TEST RESULTS:  The [facility] CustomNext-Cancer +RNAinsight Panel  found no pathogenic mutations. The CustomNext-Cancer+RNAinsight panel offered by [facility] includes sequencing and rearrangement analysis for the following 47 genes:  APC, ATM, AXIN2, BARD1, BMPR1A, BRCA1, BRCA2, BRIP1, CDH1, CDK4, CDKN2A, CHEK2, DICER1, EPCAM, GREM1, HOXB13, MEN1, MLH1, MSH2, MSH3, MSH6, MUTYH, NBN, NF1, NF2, NTHL1, PALB2, PMS2, POLD1, POLE, PTEN, RAD51C, RAD51D, RECQL, RET, SDHA, SDHAF2, SDHB, SDHC, SDHD, SMAD4, SMARCA4, STK11, TP53, TSC1, TSC2, and VHL.  RNA data is routinely analyzed for use in variant interpretation for all genes.  The test report has been scanned into EPIC and is located under the Molecular Pathology section of the Results Review tab.  A portion of the result report is included below for reference. Genetic testing reported out on February 21, 2021.      Even though a pathogenic variant was not identified, possible explanations for the cancer in the family may include: There may be no hereditary risk for cancer in the family. The cancers in [name] and/or her family may be sporadic/familial or due to other genetic and environmental factors. There may be a gene mutation in one of these genes that current testing methods cannot detect but that chance is small. There could be another gene that has not yet been discovered, or that we have not yet tested, that is responsible for the cancer diagnoses in the family.  It is also possible there is a hereditary cause for the cancer in the family that Ms. Reach did not inherit.  Therefore, it is important to remain in touch with cancer genetics in the future so that we can continue to offer [name] the most up to date genetic testing.   ADDITIONAL GENETIC TESTING:  We discussed with Ms. Harbin that her genetic testing was fairly extensive.  If there are other relevant genes identified to increase cancer risk that can be analyzed in the future, we would be happy to discuss and coordinate this testing at that  time.    CANCER SCREENING RECOMMENDATIONS:  [name] test result is considered negative (normal).  This means that we have not identified a hereditary cause for her personal history of breast cancer at this time.   An individual's cancer risk and medical management are not determined by genetic test results alone. Overall cancer risk assessment incorporates additional factors, including personal medical history, family history, and any available genetic information that may result in a personalized plan for cancer prevention and surveillance. Therefore, it is recommended she continue to follow the cancer management and screening guidelines provided by her oncology and primary healthcare provider.  RECOMMENDATIONS FOR FAMILY MEMBERS:   Since she did not inherit a identifiable mutation in a cancer predisposition gene included on this panel, her children could not have inherited a known mutation from her in one of these genes. Individuals in this family might be at some increased risk of developing cancer, over the general population risk, due to the family history of cancer.  Individuals in the family should notify their providers of the family history of cancer. We recommend women in this family have a yearly mammogram beginning at age 63, or 74 years younger than the earliest onset of cancer, an annual clinical breast exam, and perform monthly breast self-exams.  Family members should have colonoscopies by at age 2, or earlier, as recommended by their providers.    FOLLOW-UP:  Lastly, we discussed  with Ms. Rogoff that cancer genetics is a rapidly advancing field and it is possible that new genetic tests will be appropriate for her and/or her family members in the future. We encouraged her to remain in contact with cancer genetics on an annual basis so we can update her personal and family histories and let her know of advances in cancer genetics that may benefit this family.   Our contact number  was provided. Ms. Kopinski questions were answered to her satisfaction, and she knows she is welcome to call us at anytime with additional questions or concerns.   Rhealyn Cullen M. Joette Catching, [place], [facility] Genetic Counselor [email or web address] (P) [phone]

## 2021-02-24 ENCOUNTER — Encounter: Payer: Self-pay | Admitting: *Deleted

## 2021-02-24 NOTE — Progress Notes (Signed)
[facility] CSW Progress Notes  Social Work Intern contacted patient by phone per [facility] follow up protocol. Pt reports she is feeling well. She's had several appointments since clinic and "everyone has been extremely responsive and informative." Patient expressed gratitude for good news from genetics and biopsy results, and for personal support team- husband, mom and 2 best friends. Weekly grief group for loss of father is another supportive community.  Patient denies any resource needs and is waiting on treatment plan to explore [facility] support services more fully.   Janet Snyder, Social Work Intern Supervised Gwinda Maine, LCSW

## 2021-02-25 ENCOUNTER — Other Ambulatory Visit: Payer: Self-pay

## 2021-02-25 ENCOUNTER — Ambulatory Visit
Admission: RE | Admit: 2021-02-25 | Discharge: 2021-02-25 | Disposition: A | Discharge status: Home / Self Care | Payer: Managed Care, Other (non HMO) | Source: Ambulatory Visit | Attending: General Surgery | Admitting: General Surgery

## 2021-02-25 ENCOUNTER — Other Ambulatory Visit: Payer: Self-pay | Admitting: General Surgery

## 2021-02-25 DIAGNOSIS — C50211 Malignant neoplasm of upper-inner quadrant of right female breast: Secondary | ICD-10-CM

## 2021-02-25 DIAGNOSIS — Z170 Estrogen receptor positive status [ER+]: Secondary | ICD-10-CM

## 2021-02-26 ENCOUNTER — Encounter: Payer: Self-pay | Admitting: *Deleted

## 2021-03-02 ENCOUNTER — Encounter: Payer: Self-pay | Admitting: *Deleted

## 2021-03-04 ENCOUNTER — Encounter: Payer: Self-pay | Admitting: *Deleted

## 2021-03-04 ENCOUNTER — Encounter: Payer: Self-pay | Admitting: Plastic Surgery

## 2021-03-04 ENCOUNTER — Telehealth: Payer: Self-pay | Admitting: *Deleted

## 2021-03-04 NOTE — Telephone Encounter (Signed)
Pt called with questions regarding timing of sx. Informed pt Dr. Donne Hazel will try to complete sx this year in 2022 if possible. Contact information provided for further questions or needs.

## 2021-03-05 ENCOUNTER — Other Ambulatory Visit: Payer: Self-pay | Admitting: General Surgery

## 2021-03-06 ENCOUNTER — Encounter: Payer: Self-pay | Admitting: *Deleted

## 2021-03-11 ENCOUNTER — Other Ambulatory Visit: Payer: Self-pay

## 2021-03-11 NOTE — H&P (Signed)
Subjective:     Patient ID: Janet Snyder is a 45 y.o. female.   HPI   Here for follow up breast reconstruction. Presented following screening MMG with right breast distortion. Diagnostic MMG/US showed mass at 1 o clock 2 cmfn, 1.3 x 2.4 x 1.2 cm. Calcifications present extending anterior and posterior to the distortion, spaning 8 cm. No axillary adenopathy. Biopsy demonstrated IDC with DCIS, ER/PR+, Her2-.   MRI showed piopsy-proven malignancy over the right UIQ measuring 1.6 x 1.8 x 1.2 cm. Focal NME over the right LOQ  measuring 8 x 10 x 7 mm noted.   US guided biopsies of calcs completed. Specimens labeled right breast, right UIQ, posterior depth,and right breast  UIQ, anterior both with DCIS ER/PR+.   Plan Oncotype on surgical specimen. Given above span of disease mastectomy recommended.   Genetics negative.   Current 36 DD. 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 Lives with spouse and kids ages 45,11,14.   Review of Systems      Objective:   Physical Exam Cardiovascular:     Rate and Rhythm: Normal rate and regular rhythm.     Heart sounds: Normal heart sounds.  Pulmonary:     Effort: Pulmonary effort is normal.     Breath sounds: Normal breath sounds.  Lymphadenopathy:     Upper Body:     Right upper body: No axillary adenopathy.     Left upper body: No axillary adenopathy.  Skin:    Comments: Fitzpatrick 6     Breasts: Grade 2 ptosis bilateral SN to nipple R 27 L 27 cm No palpable masses BW R 22 L 22 cm CW 15 cm Nipple to IMF R 11 L 11 cm      Assessment:     Right breast ca UIQ ER+    Plan:     Plan right mastectomy with tissue expander acellular dermis reconstruction.   Given her breast size and ptosis I do not feel she is a candidate for NSM. Discussed SRM with anchor type scars.   Reviewed drains, OR length, hospital stay and recovery, limitations. Discussed  process of expansion and implant based risks including rupture, imaging surveillance for silicone implants, infection requiring surgery or removal, contracture.    Discussed use of acellular dermis in reconstruction, cadaveric source, incorporation over several weeks, risk that if has seroma or infection can act as additional nidus for infection if not incorporated.    Discussed prepectoral vs sub pectoral reconstruction. Discussed with patient and benefit of this is no animation deformity, may be less pain. Risk may be more visible rippling over upper poles, greater need of ADM. Reviewed pre pectoral would require larger amount acellular dermis, more drains. Discussed any type reconstruction also risks long term displacement implant and visible rippling. If prepectoral counseled I would recommend she be comfortable with silicone implants as more options that have less rippling. She agrees to prepectoral placement.   Additional risks including but not limited to bleeding hematoma seroma need for additional procedures damage to adjacent structures asymmetry unacceptable cosmetic result blood clots in legs or lungs reviewed.    Drain teaching completed. Rx for [facility] [place] given. Rx for robaxin oxycodone and Bactrim given.

## 2021-03-13 ENCOUNTER — Encounter (HOSPITAL_BASED_OUTPATIENT_CLINIC_OR_DEPARTMENT_OTHER): Payer: Self-pay | Admitting: General Surgery

## 2021-03-13 ENCOUNTER — Other Ambulatory Visit: Payer: Self-pay

## 2021-03-16 NOTE — Progress Notes (Signed)
Text message reminder sent to come to [facility]  to pick up pre-surgery drink and surgical soap.

## 2021-03-16 NOTE — Progress Notes (Signed)
° ° ° ° °  Enhanced Recovery after Surgery for Orthopedics Enhanced Recovery after Surgery is a protocol used to improve the stress on your body and your recovery after surgery.  Patient Instructions  The night before surgery:  No food after midnight. ONLY clear liquids after midnight  The day of surgery (if you do NOT have diabetes):  Drink ONE (1) Pre-Surgery Clear Ensure as directed.   This drink was given to you during your hospital  pre-op appointment visit. The pre-op nurse will instruct you on the time to drink the  Pre-Surgery Ensure depending on your surgery time. Finish the drink at the designated time by the pre-op nurse.  Nothing else to drink after completing the  Pre-Surgery Clear Ensure.  The day of surgery (if you have diabetes): Drink ONE (1) Gatorade 2 (G2) as directed. This drink was given to you during your hospital  pre-op appointment visit.  The pre-op nurse will instruct you on the time to drink the   Gatorade 2 (G2) depending on your surgery time. Color of the Gatorade may vary. Red is not allowed. Nothing else to drink after completing the  Gatorade 2 (G2).         If you have questions, please contact your surgeons office.  Pre surgical soap given with instructions. Patient verbalized understanding.

## 2021-03-20 ENCOUNTER — Other Ambulatory Visit: Payer: Self-pay

## 2021-03-20 ENCOUNTER — Encounter (HOSPITAL_BASED_OUTPATIENT_CLINIC_OR_DEPARTMENT_OTHER)
Admission: RE | Disposition: A | Discharge status: Home / Self Care | Payer: Self-pay | Source: Home / Self Care | Attending: Plastic Surgery

## 2021-03-20 ENCOUNTER — Ambulatory Visit (HOSPITAL_BASED_OUTPATIENT_CLINIC_OR_DEPARTMENT_OTHER): Payer: Managed Care, Other (non HMO) | Admitting: Anesthesiology

## 2021-03-20 ENCOUNTER — Encounter (HOSPITAL_BASED_OUTPATIENT_CLINIC_OR_DEPARTMENT_OTHER): Payer: Self-pay | Admitting: General Surgery

## 2021-03-20 ENCOUNTER — Observation Stay (HOSPITAL_BASED_OUTPATIENT_CLINIC_OR_DEPARTMENT_OTHER)
Admission: RE | Admit: 2021-03-20 | Discharge: 2021-03-21 | Disposition: A | Discharge status: Home / Self Care | Payer: Managed Care, Other (non HMO) | Attending: Plastic Surgery | Admitting: Plastic Surgery

## 2021-03-20 DIAGNOSIS — C50911 Malignant neoplasm of unspecified site of right female breast: Secondary | ICD-10-CM

## 2021-03-20 DIAGNOSIS — Z170 Estrogen receptor positive status [ER+]: Secondary | ICD-10-CM | POA: Diagnosis not present

## 2021-03-20 DIAGNOSIS — D0511 Intraductal carcinoma in situ of right breast: Principal | ICD-10-CM | POA: Insufficient documentation

## 2021-03-20 HISTORY — DX: Other specified postprocedural states: Z98.890

## 2021-03-20 HISTORY — PX: BREAST RECONSTRUCTION WITH PLACEMENT OF TISSUE EXPANDER AND ALLODERM: SHX6805

## 2021-03-20 HISTORY — DX: Nausea with vomiting, unspecified: R11.2

## 2021-03-20 HISTORY — PX: MASTECTOMY W/ SENTINEL NODE BIOPSY: SHX2001

## 2021-03-20 LAB — POCT PREGNANCY, URINE: Preg Test, Ur: NEGATIVE

## 2021-03-20 SURGERY — MASTECTOMY WITH SENTINEL LYMPH NODE BIOPSY
Anesthesia: General | Site: Breast | Laterality: Right

## 2021-03-20 MED ORDER — MAGTRACE LYMPHATIC TRACER
INTRAMUSCULAR | Status: DC | PRN
  Administered 2021-03-20: 07:00:00 2 mL via INTRAMUSCULAR

## 2021-03-20 MED ORDER — MIDAZOLAM HCL 2 MG/2ML IJ SOLN
2.0000 mg | Freq: Once | INTRAMUSCULAR | Status: AC
  Administered 2021-03-20: 07:00:00 2 mg via INTRAVENOUS

## 2021-03-20 MED ORDER — DEXAMETHASONE SODIUM PHOSPHATE 10 MG/ML IJ SOLN
INTRAMUSCULAR | Status: DC | PRN
  Administered 2021-03-20: 10 mg via INTRAVENOUS

## 2021-03-20 MED ORDER — DROPERIDOL 2.5 MG/ML IJ SOLN
INTRAMUSCULAR | Status: DC | PRN
  Administered 2021-03-20: .625 mg via INTRAVENOUS

## 2021-03-20 MED ORDER — ONDANSETRON HCL 4 MG/2ML IJ SOLN: 4.0000 mg | Freq: Four times a day (QID) | INTRAMUSCULAR | Status: DC | PRN

## 2021-03-20 MED ORDER — LIDOCAINE 2% (20 MG/ML) 5 ML SYRINGE
INTRAMUSCULAR | Status: AC
  Filled 2021-03-20: qty 5

## 2021-03-20 MED ORDER — SODIUM CHLORIDE 0.9 % IV SOLN
INTRAVENOUS | Status: DC | PRN
  Administered 2021-03-20: 09:00:00 500 mL

## 2021-03-20 MED ORDER — FENTANYL CITRATE (PF) 100 MCG/2ML IJ SOLN
INTRAMUSCULAR | Status: AC
  Filled 2021-03-20: qty 2

## 2021-03-20 MED ORDER — ROPIVACAINE HCL 5 MG/ML IJ SOLN
INTRAMUSCULAR | Status: DC | PRN
  Administered 2021-03-20: 30 mL via PERINEURAL

## 2021-03-20 MED ORDER — ENSURE PRE-SURGERY PO LIQD: 296.0000 mL | Freq: Once | ORAL | Status: DC

## 2021-03-20 MED ORDER — LACTATED RINGERS IV SOLN: INTRAVENOUS | Status: DC

## 2021-03-20 MED ORDER — MIDAZOLAM HCL 2 MG/2ML IJ SOLN
INTRAMUSCULAR | Status: AC
  Filled 2021-03-20: qty 2

## 2021-03-20 MED ORDER — METHOCARBAMOL 500 MG PO TABS
500.0000 mg | ORAL_TABLET | Freq: Four times a day (QID) | ORAL | Status: DC | PRN
  Administered 2021-03-20: 17:00:00 500 mg via ORAL
  Filled 2021-03-20: qty 1

## 2021-03-20 MED ORDER — ONDANSETRON HCL 4 MG/2ML IJ SOLN
INTRAMUSCULAR | Status: AC
  Filled 2021-03-20: qty 2

## 2021-03-20 MED ORDER — PROMETHAZINE HCL 25 MG/ML IJ SOLN: 6.2500 mg | INTRAMUSCULAR | Status: DC | PRN

## 2021-03-20 MED ORDER — HYDROMORPHONE HCL 1 MG/ML IJ SOLN: 0.2500 mg | INTRAMUSCULAR | Status: DC | PRN

## 2021-03-20 MED ORDER — PHENYLEPHRINE 40 MCG/ML (10ML) SYRINGE FOR IV PUSH (FOR BLOOD PRESSURE SUPPORT)
PREFILLED_SYRINGE | INTRAVENOUS | Status: AC
  Filled 2021-03-20: qty 10

## 2021-03-20 MED ORDER — MORPHINE SULFATE (PF) 4 MG/ML IV SOLN: 1.0000 mg | INTRAVENOUS | Status: DC | PRN

## 2021-03-20 MED ORDER — ONDANSETRON 4 MG PO TBDP: 4.0000 mg | ORAL_TABLET | Freq: Four times a day (QID) | ORAL | Status: DC | PRN

## 2021-03-20 MED ORDER — MEPERIDINE HCL 25 MG/ML IJ SOLN: 6.2500 mg | INTRAMUSCULAR | Status: DC | PRN

## 2021-03-20 MED ORDER — OXYCODONE HCL 5 MG PO TABS: 5.0000 mg | ORAL_TABLET | Freq: Once | ORAL | Status: DC | PRN

## 2021-03-20 MED ORDER — ACETAMINOPHEN 500 MG PO TABS
ORAL_TABLET | ORAL | Status: AC
  Filled 2021-03-20: qty 2

## 2021-03-20 MED ORDER — LIDOCAINE 2% (20 MG/ML) 5 ML SYRINGE
INTRAMUSCULAR | Status: DC | PRN
  Administered 2021-03-20: 80 mg via INTRAVENOUS

## 2021-03-20 MED ORDER — PROPOFOL 10 MG/ML IV BOLUS
INTRAVENOUS | Status: DC | PRN
  Administered 2021-03-20: 140 mg via INTRAVENOUS

## 2021-03-20 MED ORDER — ROCURONIUM BROMIDE 10 MG/ML (PF) SYRINGE
PREFILLED_SYRINGE | INTRAVENOUS | Status: AC
  Filled 2021-03-20: qty 10

## 2021-03-20 MED ORDER — CHLORHEXIDINE GLUCONATE CLOTH 2 % EX PADS: 6.0000 | MEDICATED_PAD | Freq: Once | CUTANEOUS | Status: DC

## 2021-03-20 MED ORDER — CEFAZOLIN SODIUM-DEXTROSE 2-4 GM/100ML-% IV SOLN
INTRAVENOUS | Status: AC
  Filled 2021-03-20: qty 100

## 2021-03-20 MED ORDER — ONDANSETRON HCL 4 MG/2ML IJ SOLN
INTRAMUSCULAR | Status: DC | PRN
  Administered 2021-03-20: 4 mg via INTRAVENOUS

## 2021-03-20 MED ORDER — KETOROLAC TROMETHAMINE 15 MG/ML IJ SOLN
INTRAMUSCULAR | Status: AC
  Filled 2021-03-20: qty 1

## 2021-03-20 MED ORDER — SCOPOLAMINE 1 MG/3DAYS TD PT72
MEDICATED_PATCH | TRANSDERMAL | Status: AC
  Filled 2021-03-20: qty 1

## 2021-03-20 MED ORDER — SUGAMMADEX SODIUM 200 MG/2ML IV SOLN: INTRAVENOUS | Status: DC | PRN

## 2021-03-20 MED ORDER — ACETAMINOPHEN 500 MG PO TABS
1000.0000 mg | ORAL_TABLET | Freq: Four times a day (QID) | ORAL | Status: DC
  Administered 2021-03-20: 1000 mg via ORAL
  Filled 2021-03-20: qty 2

## 2021-03-20 MED ORDER — SODIUM CHLORIDE 0.9 % IV SOLN: INTRAVENOUS | Status: DC

## 2021-03-20 MED ORDER — CEFAZOLIN SODIUM-DEXTROSE 2-4 GM/100ML-% IV SOLN
2.0000 g | INTRAVENOUS | Status: AC
  Administered 2021-03-20: 08:00:00 2 g via INTRAVENOUS

## 2021-03-20 MED ORDER — SODIUM CHLORIDE 0.9 % IV SOLN
INTRAVENOUS | Status: AC
  Filled 2021-03-20: qty 10

## 2021-03-20 MED ORDER — FENTANYL CITRATE (PF) 100 MCG/2ML IJ SOLN
INTRAMUSCULAR | Status: DC | PRN
  Administered 2021-03-20 (×2): 50 ug via INTRAVENOUS

## 2021-03-20 MED ORDER — ACETAMINOPHEN 500 MG PO TABS
1000.0000 mg | ORAL_TABLET | ORAL | Status: AC
  Administered 2021-03-20: 07:00:00 1000 mg via ORAL

## 2021-03-20 MED ORDER — SUGAMMADEX SODIUM 200 MG/2ML IV SOLN
INTRAVENOUS | Status: DC | PRN
  Administered 2021-03-20: 180 mg via INTRAVENOUS

## 2021-03-20 MED ORDER — OXYCODONE HCL 5 MG/5ML PO SOLN: 5.0000 mg | Freq: Once | ORAL | Status: DC | PRN

## 2021-03-20 MED ORDER — FENTANYL CITRATE (PF) 100 MCG/2ML IJ SOLN
100.0000 ug | Freq: Once | INTRAMUSCULAR | Status: AC
  Administered 2021-03-20: 07:00:00 100 ug via INTRAVENOUS

## 2021-03-20 MED ORDER — EPHEDRINE SULFATE 50 MG/ML IJ SOLN
INTRAMUSCULAR | Status: DC | PRN
  Administered 2021-03-20: 15 mg via INTRAVENOUS
  Administered 2021-03-20: 10 mg via INTRAVENOUS
  Administered 2021-03-20: 5 mg via INTRAVENOUS

## 2021-03-20 MED ORDER — DROPERIDOL 2.5 MG/ML IJ SOLN
INTRAMUSCULAR | Status: AC
  Filled 2021-03-20: qty 2

## 2021-03-20 MED ORDER — BUPIVACAINE HCL (PF) 0.5 % IJ SOLN
INTRAMUSCULAR | Status: AC
  Filled 2021-03-20: qty 30

## 2021-03-20 MED ORDER — ROCURONIUM BROMIDE 100 MG/10ML IV SOLN
INTRAVENOUS | Status: DC | PRN
  Administered 2021-03-20: 80 mg via INTRAVENOUS

## 2021-03-20 MED ORDER — PROPOFOL 10 MG/ML IV BOLUS
INTRAVENOUS | Status: AC
  Filled 2021-03-20: qty 20

## 2021-03-20 MED ORDER — DEXAMETHASONE SODIUM PHOSPHATE 10 MG/ML IJ SOLN
INTRAMUSCULAR | Status: AC
  Filled 2021-03-20: qty 1

## 2021-03-20 MED ORDER — KETOROLAC TROMETHAMINE 15 MG/ML IJ SOLN
15.0000 mg | INTRAMUSCULAR | Status: AC
  Administered 2021-03-20: 07:00:00 15 mg via INTRAVENOUS

## 2021-03-20 MED ORDER — OXYCODONE HCL 5 MG PO TABS
5.0000 mg | ORAL_TABLET | ORAL | Status: DC | PRN
  Administered 2021-03-20: 17:00:00 5 mg via ORAL
  Filled 2021-03-20: qty 1

## 2021-03-20 MED ORDER — ENOXAPARIN SODIUM 40 MG/0.4ML IJ SOSY
40.0000 mg | PREFILLED_SYRINGE | INTRAMUSCULAR | Status: DC
  Filled 2021-03-20: qty 0.4

## 2021-03-20 MED ORDER — PHENYLEPHRINE HCL (PRESSORS) 10 MG/ML IV SOLN
INTRAVENOUS | Status: DC | PRN
  Administered 2021-03-20: 40 ug via INTRAVENOUS
  Administered 2021-03-20: 80 ug via INTRAVENOUS
  Administered 2021-03-20 (×3): 40 ug via INTRAVENOUS
  Administered 2021-03-20 (×2): 80 ug via INTRAVENOUS

## 2021-03-20 MED ORDER — SCOPOLAMINE 1 MG/3DAYS TD PT72
1.0000 | MEDICATED_PATCH | TRANSDERMAL | Status: DC
  Administered 2021-03-20: 07:00:00 1.5 mg via TRANSDERMAL

## 2021-03-20 SURGICAL SUPPLY — 107 items
ADH SKN CLS APL DERMABOND .7 (GAUZE/BANDAGES/DRESSINGS) ×2
ALLODERM 16X20 PERFORATED (Tissue) ×1 IMPLANT
ALLOGRAFT PERF 16X20 1.6+/-0.4 (Tissue) ×1 IMPLANT
APL PRP STRL LF DISP 70% ISPRP (MISCELLANEOUS) ×2
APL SKNCLS STERI-STRIP NONHPOA (GAUZE/BANDAGES/DRESSINGS)
APPLIER CLIP 11 MED OPEN (CLIP)
APPLIER CLIP 9.375 MED OPEN (MISCELLANEOUS) ×3
APR CLP MED 11 20 MLT OPN (CLIP)
APR CLP MED 9.3 20 MLT OPN (MISCELLANEOUS) ×1
BAG DECANTER FOR FLEXI CONT (MISCELLANEOUS) ×3 IMPLANT
BENZOIN TINCTURE PRP APPL 2/3 (GAUZE/BANDAGES/DRESSINGS) IMPLANT
BINDER BREAST LRG (GAUZE/BANDAGES/DRESSINGS) ×2 IMPLANT
BINDER BREAST MEDIUM (GAUZE/BANDAGES/DRESSINGS) IMPLANT
BINDER BREAST XLRG (GAUZE/BANDAGES/DRESSINGS) IMPLANT
BINDER BREAST XXLRG (GAUZE/BANDAGES/DRESSINGS) IMPLANT
BIOPATCH RED 1 DISK 7.0 (GAUZE/BANDAGES/DRESSINGS) IMPLANT
BIOPATCH RED 1IN DISK 7.0MM (GAUZE/BANDAGES/DRESSINGS)
BLADE CLIPPER SURG (BLADE) IMPLANT
BLADE HEX COATED 2.75 (ELECTRODE) IMPLANT
BLADE SURG 10 STRL SS (BLADE) ×3 IMPLANT
BLADE SURG 15 STRL LF DISP TIS (BLADE) ×1 IMPLANT
BLADE SURG 15 STRL SS (BLADE) ×6
BNDG GAUZE ELAST 4 BULKY (GAUZE/BANDAGES/DRESSINGS) IMPLANT
CANISTER SUCT 1200ML W/VALVE (MISCELLANEOUS) ×3 IMPLANT
CHLORAPREP W/TINT 26 (MISCELLANEOUS) ×5 IMPLANT
CLIP APPLIE 11 MED OPEN (CLIP) IMPLANT
CLIP APPLIE 9.375 MED OPEN (MISCELLANEOUS) IMPLANT
CLIP TI WIDE RED SMALL 6 (CLIP) IMPLANT
CLOSURE WOUND 1/2 X4 (GAUZE/BANDAGES/DRESSINGS)
COVER BACK TABLE 60X90IN (DRAPES) ×3 IMPLANT
COVER MAYO STAND STRL (DRAPES) ×3 IMPLANT
COVER PROBE W GEL 5X96 (DRAPES) ×3 IMPLANT
DECANTER SPIKE VIAL GLASS SM (MISCELLANEOUS) IMPLANT
DERMABOND ADVANCED (GAUZE/BANDAGES/DRESSINGS) ×4
DERMABOND ADVANCED .7 DNX12 (GAUZE/BANDAGES/DRESSINGS) ×1 IMPLANT
DRAIN CHANNEL 15F RND FF W/TCR (WOUND CARE) ×2 IMPLANT
DRAIN CHANNEL 19F RND (DRAIN) ×3 IMPLANT
DRAPE INCISE IOBAN 66X45 STRL (DRAPES) ×2 IMPLANT
DRAPE TOP ARMCOVERS (MISCELLANEOUS) ×3 IMPLANT
DRAPE U-SHAPE 76X120 STRL (DRAPES) ×3 IMPLANT
DRAPE UTILITY XL STRL (DRAPES) ×5 IMPLANT
DRSG PAD ABDOMINAL 8X10 ST (GAUZE/BANDAGES/DRESSINGS) ×6 IMPLANT
DRSG TEGADERM 2-3/8X2-3/4 SM (GAUZE/BANDAGES/DRESSINGS) ×2 IMPLANT
DRSG TEGADERM 4X10 (GAUZE/BANDAGES/DRESSINGS) ×4 IMPLANT
DRSG TEGADERM 4X4.75 (GAUZE/BANDAGES/DRESSINGS) IMPLANT
ELECT BLADE 4.0 EZ CLEAN MEGAD (MISCELLANEOUS)
ELECT COATED BLADE 2.86 ST (ELECTRODE) ×3 IMPLANT
ELECT REM PT RETURN 9FT ADLT (ELECTROSURGICAL) ×3
ELECTRODE BLDE 4.0 EZ CLN MEGD (MISCELLANEOUS) ×1 IMPLANT
ELECTRODE REM PT RTRN 9FT ADLT (ELECTROSURGICAL) ×1 IMPLANT
EVACUATOR SILICONE 100CC (DRAIN) ×5 IMPLANT
EXPANDER TISSUE FV FOURTE 400 (Prosthesis & Implant Plastic) IMPLANT
GAUZE SPONGE 4X4 12PLY STRL LF (GAUZE/BANDAGES/DRESSINGS) IMPLANT
GLOVE SURG ENC MOIS LTX SZ7 (GLOVE) ×3 IMPLANT
GLOVE SURG HYDRASOFT LTX SZ5.5 (GLOVE) ×3 IMPLANT
GLOVE SURG POLYISO LF SZ7 (GLOVE) ×2 IMPLANT
GLOVE SURG UNDER POLY LF SZ7 (GLOVE) ×2 IMPLANT
GLOVE SURG UNDER POLY LF SZ7.5 (GLOVE) ×3 IMPLANT
GOWN STRL REUS W/ TWL LRG LVL3 (GOWN DISPOSABLE) ×3 IMPLANT
GOWN STRL REUS W/TWL LRG LVL3 (GOWN DISPOSABLE) ×15
HEMOSTAT ARISTA ABSORB 3G PWDR (HEMOSTASIS) IMPLANT
LIGHT WAVEGUIDE WIDE FLAT (MISCELLANEOUS) ×2 IMPLANT
MARKER SKIN DUAL TIP RULER LAB (MISCELLANEOUS) IMPLANT
NDL HYPO 25X1 1.5 SAFETY (NEEDLE) IMPLANT
NDL SAFETY ECLIPSE 18X1.5 (NEEDLE) ×1 IMPLANT
NEEDLE HYPO 18GX1.5 SHARP (NEEDLE)
NEEDLE HYPO 25X1 1.5 SAFETY (NEEDLE) IMPLANT
NS IRRIG 1000ML POUR BTL (IV SOLUTION) ×3 IMPLANT
PACK BASIN DAY SURGERY FS (CUSTOM PROCEDURE TRAY) ×3 IMPLANT
PENCIL SMOKE EVACUATOR (MISCELLANEOUS) ×3 IMPLANT
PIN SAFETY STERILE (MISCELLANEOUS) ×3 IMPLANT
PUNCH BIOPSY DERMAL 4MM (MISCELLANEOUS) IMPLANT
RETRACTOR ONETRAX LX 90X20 (MISCELLANEOUS) IMPLANT
SHEET MEDIUM DRAPE 40X70 STRL (DRAPES) ×3 IMPLANT
SLEEVE SCD COMPRESS KNEE MED (STOCKING) ×3 IMPLANT
SPONGE T-LAP 18X18 ~~LOC~~+RFID (SPONGE) ×8 IMPLANT
SPONGE T-LAP 4X18 ~~LOC~~+RFID (SPONGE) IMPLANT
STAPLER VISISTAT 35W (STAPLE) ×3 IMPLANT
STRIP CLOSURE SKIN 1/2X4 (GAUZE/BANDAGES/DRESSINGS) IMPLANT
SUT CHROMIC 4 0 PS 2 18 (SUTURE) ×6 IMPLANT
SUT ETHILON 2 0 FS 18 (SUTURE) ×3 IMPLANT
SUT MNCRL AB 4-0 PS2 18 (SUTURE) ×5 IMPLANT
SUT PDS AB 2-0 CT2 27 (SUTURE) IMPLANT
SUT SILK 2 0 SH (SUTURE) ×2 IMPLANT
SUT VIC AB 2-0 SH 27 (SUTURE) ×6
SUT VIC AB 2-0 SH 27XBRD (SUTURE) ×2 IMPLANT
SUT VIC AB 3-0 54X BRD REEL (SUTURE) IMPLANT
SUT VIC AB 3-0 BRD 54 (SUTURE)
SUT VIC AB 3-0 SH 27 (SUTURE) ×3
SUT VIC AB 3-0 SH 27X BRD (SUTURE) IMPLANT
SUT VICRYL 0 CT-2 (SUTURE) ×4 IMPLANT
SUT VICRYL 3-0 CR8 SH (SUTURE) ×3 IMPLANT
SUT VICRYL 4-0 PS2 18IN ABS (SUTURE) ×2 IMPLANT
SUT VLOC 180 0 24IN GS25 (SUTURE) ×2 IMPLANT
SYR 10ML LL (SYRINGE) ×1 IMPLANT
SYR 50ML LL SCALE MARK (SYRINGE) ×1 IMPLANT
SYR BULB IRRIG 60ML STRL (SYRINGE) ×3 IMPLANT
SYR CONTROL 10ML LL (SYRINGE) IMPLANT
TAPE MEASURE VINYL STERILE (MISCELLANEOUS) IMPLANT
TISSUE EXPNDR FV FOURTE 400 (Prosthesis & Implant Plastic) ×3 IMPLANT
TOWEL GREEN STERILE FF (TOWEL DISPOSABLE) ×6 IMPLANT
TRACER MAGTRACE VIAL (MISCELLANEOUS) ×2 IMPLANT
TRAY DSU PREP LF (CUSTOM PROCEDURE TRAY) ×2 IMPLANT
TUBE CONNECTING 20'X1/4 (TUBING) ×1
TUBE CONNECTING 20X1/4 (TUBING) ×2 IMPLANT
UNDERPAD 30X36 HEAVY ABSORB (UNDERPADS AND DIAPERS) ×6 IMPLANT
YANKAUER SUCT BULB TIP NO VENT (SUCTIONS) ×3 IMPLANT

## 2021-03-20 NOTE — Anesthesia Procedure Notes (Signed)
Procedure Name: Intubation Date/Time: 03/20/2021 7:33 AM Performed by: Lavonia Dana, CRNA Pre-anesthesia Checklist: Patient identified, Emergency Drugs available, Suction available and Patient being monitored Patient Re-evaluated:Patient Re-evaluated prior to induction Oxygen Delivery Method: Circle system utilized Preoxygenation: Pre-oxygenation with 100% oxygen Induction Type: IV induction Ventilation: Mask ventilation without difficulty Laryngoscope Size: Mac and 3 Grade View: Grade I Tube type: Oral Tube size: 7.0 mm Number of attempts: 1 Airway Equipment and Method: Stylet and Bite block Placement Confirmation: ETT inserted through vocal cords under direct vision, positive ETCO2 and breath sounds checked- equal and bilateral Secured at: 23 cm Tube secured with: Tape Dental Injury: Teeth and Oropharynx as per pre-operative assessment

## 2021-03-20 NOTE — Interval H&P Note (Signed)
History and Physical Interval Note:  03/20/2021 6:50 AM  Janet Snyder  has presented today for surgery, with the diagnosis of RIGHT BREAST CANCER.  The various methods of treatment have been discussed with the patient and family. After consideration of risks, benefits and other options for treatment, the patient has consented to  Procedure(s): RIGHT MASTECTOMY WITH RIGHT AXILLARY SENTINEL LYMPH NODE BIOPSY (Right) RIGHT BREAST RECONSTRUCTION WITH PLACEMENT OF TISSUE EXPANDER AND ALLODERM (Right) as a surgical intervention.  The patient's history has been reviewed, patient examined, no change in status, stable for surgery.  I have reviewed the patient's chart and labs.  Questions were answered to the patient's satisfaction.     Rolm Bookbinder

## 2021-03-20 NOTE — Op Note (Addendum)
Operative Note   DATE OF OPERATION: 12.23.22  LOCATION: [facility]-observation  SURGICAL DIVISION: Plastic Surgery  PREOPERATIVE DIAGNOSES:  Right breast ca UIQ ER+  POSTOPERATIVE DIAGNOSES:  same  PROCEDURE:  1. Right breast reconstruction with tissue expander 2. Acellular dermis (Alloderm) 300 cm2 to right chest  SURGEON: Irene Limbo MD MBA  ASSISTANT: none  ANESTHESIA:  General.   EBL: 50 ml for entire procedure  COMPLICATIONS: None immediate.   INDICATIONS FOR PROCEDURE:  The patient, Janet Snyder, is a 45 y.o. female born on [birth year], is here for immediate prepectoral tissue expander reconstruction following skin reduction pattern mastectomy.   FINDINGS: Natrelle 133S FV-12-T 400 ml tissue expander placed, initial fill volume 150 ml air. SN [id number]  DESCRIPTION OF PROCEDURE:  The patient was marked to mark sternal notch, chest midline, anterior axillary lines and inframammary folds. Patient was marked for skin reduction mastectomy with most superior portion nipple areola marked on breast meridian. Vertical limbs marked by breast displacement and set at 9 cm length. The patient was taken to the operating room. SCDs were placed and IV antibiotics were given. The patient's operative site was prepped and draped in a sterile fashion. A time out was performed and all information was confirmed to be correct. I assisted in mastectomy with retraction and exposure. Following completion of mastectomy, the lateral limbs for resection marked and area over lower pole preserved as inferiorly based dermal pedicle. Skin de epithelialized in this area.    The cavity was irrigated with saline solution containing Ancef, gentamicin, and Betadine. Hemostasis was ensured. A 19 Fr drain was placed in subcutaneous position laterally and a 15 Fr drain placed along inframammary fold. Each secured to skin with 2-0 nylon. The tissue expander was prepared on back table prior in insertion. The  expander was filled with air. Perforated acellular dermis was  draped over anterior surface expander. The ADM was then secured to itself over posterior surface of expander with 4-0 chromic. Redundant folds acellular dermis excised so that the ADM lay flat without folds over air filled expander. The expander was secured to medial insertion pectoralis with a 0 vicryl. The superior and lateral tabs also secured to pectoralis muscle with 0-vicryl. The ADM was secured to pectoralis muscle and chest wall along inferior border at inframammary fold with 0 V lock. Laterally the mastectomy flap over posterior axillary line was advanced anteriorly and the subcutaneous tissue and superficial fascia was secured to chest wall with 0-vicryl. The inferiorly based dermal pedicle was redraped superiorly over expander and acellular dermis and secured to pectoralis with interrupted 0-vicryl. Skin closure completed with 3-0 vicryl in fascial layer and 4-0 vicryl in dermis. Skin closure completed with 4-0 monocryl subcuticular and tissue adhesive. Tegaderms applied bilateral, followed by dry dressing and breast binder.   The patient was allowed to wake from anesthesia, extubated and taken to the recovery room in satisfactory condition.   SPECIMENS: none  DRAINS: 15 and 19 Fr JP in right breast reconstruction

## 2021-03-20 NOTE — Anesthesia Postprocedure Evaluation (Signed)
Anesthesia Post Note  Patient: Janet Snyder  Procedure(s) Performed: RIGHT MASTECTOMY WITH RIGHT AXILLARY SENTINEL LYMPH NODE BIOPSY (Right: Breast) RIGHT BREAST RECONSTRUCTION WITH PLACEMENT OF TISSUE EXPANDER AND ALLODERM (Right: Breast)     Patient location during evaluation: PACU Anesthesia Type: General Level of consciousness: awake and alert Pain management: pain level controlled Vital Signs Assessment: post-procedure vital signs reviewed and stable Respiratory status: spontaneous breathing, nonlabored ventilation and respiratory function stable Cardiovascular status: blood pressure returned to baseline and stable Postop Assessment: no apparent nausea or vomiting Anesthetic complications: no   No notable events documented.  Last Vitals:  Vitals:   03/20/21 1015 03/20/21 1030  BP: 112/63 110/66  Pulse: 70 67  Resp: 17 16  Temp:    SpO2: 100% 98%    Last Pain:  Vitals:   03/20/21 1030  TempSrc:   PainSc: 0-No pain                 Lynda Rainwater

## 2021-03-20 NOTE — Interval H&P Note (Signed)
History and Physical Interval Note:  03/20/2021 6:58 AM  Janet Snyder  has presented today for surgery, with the diagnosis of RIGHT BREAST CANCER.  The various methods of treatment have been discussed with the patient and family. After consideration of risks, benefits and other options for treatment, the patient has consented to  Procedure(s): RIGHT MASTECTOMY WITH RIGHT AXILLARY SENTINEL LYMPH NODE BIOPSY (Right) RIGHT BREAST RECONSTRUCTION WITH PLACEMENT OF TISSUE EXPANDER AND ALLODERM (Right) as a surgical intervention.  The patient's history has been reviewed, patient examined, no change in status, stable for surgery.  I have reviewed the patient's chart and labs.  Questions were answered to the patient's satisfaction.     Arnoldo Hooker Ryonna Cimini

## 2021-03-20 NOTE — Progress Notes (Signed)
Magtrace injection perfomed by Dr Donne Hazel, Pt tol well. Emotional support provided.

## 2021-03-20 NOTE — Progress Notes (Signed)
Assisted Dr. Miller with right, ultrasound guided, pectoralis block. Side rails up, monitors on throughout procedure. See vital signs in flow sheet. Tolerated Procedure well. 

## 2021-03-20 NOTE — Anesthesia Preprocedure Evaluation (Signed)
Anesthesia Evaluation  Patient identified by MRN, date of birth, ID band Patient awake    Reviewed: Allergy & Precautions, H&P , NPO status , Patient's Chart, lab work & pertinent test results  History of Anesthesia Complications (+) PONV  Airway Mallampati: II  TM Distance: >3 FB Neck ROM: full    Dental  (+) Dental Advisory Given, Chipped Small chip left front tooth:   Pulmonary neg pulmonary ROS, shortness of breath and with exertion,    Pulmonary exam normal breath sounds clear to auscultation       Cardiovascular Exercise Tolerance: Good + Orthopnea  negative cardio ROS Normal cardiovascular exam Rhythm:regular Rate:Normal     Neuro/Psych negative neurological ROS  negative psych ROS   GI/Hepatic negative GI ROS, Neg liver ROS,   Endo/Other  negative endocrine ROS  Renal/GU negative Renal ROS  negative genitourinary   Musculoskeletal   Abdominal   Peds  Hematology negative hematology ROS (+)   Anesthesia Other Findings Breast cancer  Reproductive/Obstetrics negative OB ROS                             Anesthesia Physical  Anesthesia Plan  ASA: 3  Anesthesia Plan: General   Post-op Pain Management: Regional block   Induction: Intravenous  PONV Risk Score and Plan: 4 or greater and Ondansetron, Dexamethasone, Midazolam, Droperidol and Treatment may vary due to age or medical condition  Airway Management Planned: Oral ETT  Additional Equipment:   Intra-op Plan:   Post-operative Plan: Extubation in OR  Informed Consent: I have reviewed the patients History and Physical, chart, labs and discussed the procedure including the risks, benefits and alternatives for the proposed anesthesia with the patient or authorized representative who has indicated his/her understanding and acceptance.     Dental Advisory Given  Plan Discussed with: CRNA and Surgeon  Anesthesia Plan  Comments:         Anesthesia Quick Evaluation

## 2021-03-20 NOTE — Transfer of Care (Signed)
Immediate Anesthesia Transfer of Care Note  Patient: Janet Snyder  Procedure(s) Performed: RIGHT MASTECTOMY WITH RIGHT AXILLARY SENTINEL LYMPH NODE BIOPSY (Right: Breast) RIGHT BREAST RECONSTRUCTION WITH PLACEMENT OF TISSUE EXPANDER AND ALLODERM (Right: Breast)  Patient Location: PACU  Anesthesia Type:GA combined with regional for post-op pain  Level of Consciousness: drowsy  Airway & Oxygen Therapy: Patient Spontanous Breathing and Patient connected to face mask oxygen  Post-op Assessment: Report given to RN and Post -op Vital signs reviewed and stable  Post vital signs: Reviewed and stable  Last Vitals:  Vitals Value Taken Time  BP 115/61 03/20/21 1001  Temp    Pulse 68 03/20/21 1002  Resp 15 03/20/21 1002  SpO2 100 % 03/20/21 1002  Vitals shown include unvalidated device data.  Last Pain:  Vitals:   03/20/21 0637  TempSrc: Oral  PainSc: 0-No pain      Patients Stated Pain Goal: 5 (81/84/03 7543)  Complications: No notable events documented.

## 2021-03-20 NOTE — Op Note (Signed)
Preoperative diagnosis: Clinical stage I right breast cancer Postoperative diagnosis: Same as above Procedure: 1.  Right skin sparing mastectomy 2.  Injection of mag trace for sentinel lymph node identification 3.  Right deep axillary sentinel lymph node biopsy Surgeon: Dr. Serita Grammes Anesthesia: General with a pectoral block Estimated blood loss: 30 cc Specimens: 1.  Right breast marked short superior, long lateral 2.  Right axillary sentinel lymph nodes with highest count of 7737 Complications: None Drains: Per plastic surgery Disposition Case turned over to plastic surgery for reconstruction  Indications: This is a 45 year old female who had a screening mammogram that had a 1.4 cm upper inner quadrant mass.  She had calcifications that extend 8 cm anterior to posterior around this.  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  She underwent an MRI that showed normal left breast normal lymph nodes and on the right side there is a mass measuring 1.6 cm and some focal non-mass enhancement measuring 8 x 10 x 7 mm.  Biopsy the right upper inner quadrant posterior and anterior that were done are both DCIS.  Due to this we discussed a mastectomy.  She was not a candidate for nipple sparing mastectomy.  We will also do a sentinel lymph node biopsy and expander reconstruction with plastic surgery.  Procedure: After informed consent was obtained I first did the mag trace injection in preop.  We did a timeout.  I then prepped the area lateral to the areola.  I then injected 2 cc of mag trace in the subareolar position.  Subsequent to this she underwent a pectoral block.  She had SCDs in place.  She was given antibiotics.  She was then taken to the operating room and placed under general anesthesia without complication.  She was prepped and draped in the standard sterile surgical fashion.  A  surgical timeout was then performed.  She had been marked around the nipple areola for a triangular shaped incision.  I made an incision.  I carried flaps to the sternum medially, the clavicle superiorly and the inframammary fold inferiorly.  I took this to the lateral portion of the breast.  I then rolled the breast and the fascia off of the pectoralis muscle and passed this off the table after marking it as above.  I obtained hemostasis.  I then entered into the axilla.  I used the center mag probe to identify what appeared to be 3 small lymph nodes that were both brown stained and had a lot of activity the highest being as above.  There was minimal background activity and no additional nodes that were identifiable or palpable.  I then turned the case over to plastic surgery for reconstruction.

## 2021-03-20 NOTE — H&P (Signed)
°  19 yof who has screening mm that shows c density breasts. She has a mass measuring 1.4x1.3x1.2 cm in size. She has negative ax [place]. She has calcs that extend 8 cm ant to posterior around the mass as well. Biopsy of the mass has been done that shows grade II IDC with DCIS that is 100% er/pr pos, her 2 negative and Ki is 5%.  She has no mass or dc. She has no family history present.  She underwent mri that shows nl left breast, normal nodes and on right side with mass measuring 1.6 cm and focal nme in loq measuring 8x10x7 mm. Biopsy of the rigth breast uiq posterior and anterior are both dcis. I had called her and told her she needs mastectomy. She has seen plastic surgery already    Medical History: History reviewed. No pertinent past medical history.  Patient Active Problem List  Diagnosis   Malignant neoplasm of upper-inner quadrant of right breast in female, estrogen receptor positive (CMS-HCC)   Past Surgical History:  Procedure Laterality Date   CESAREAN SECTION N/A  Date Unknown   CHOLECYSTECTOMY N/A 06/13/2012   foot surgery N/A  Date Unknown    Allergies  Allergen Reactions   Sulfa (Sulfonamide Antibiotics) Unknown   Current Outpatient Medications on File Prior to Visit  Medication Sig Dispense Refill   HYDROcodone-acetaminophen (NORCO) 5-325 mg tablet hydrocodone 5 mg-acetaminophen 325 mg tablet TAKE 1 TO 2 TABLETS EVERY 4 HOURS AS NEEDED FOR PAIN   multivitamin with minerals tablet Take 1 tablet by mouth once daily   naproxen sodium (ALEVE) 220 MG tablet Take by mouth   vitamin E 1000 UNIT capsule Take by mouth    Family History  Problem Relation Age of Onset   Obesity Mother   Stroke Father   Obesity Father   High blood pressure (Hypertension) Father   Hyperlipidemia (Elevated cholesterol) Father   Coronary Artery Disease (Blocked arteries around heart) Father   Deep vein thrombosis (DVT or abnormal blood clot formation) Father   Obesity Brother    Social  History   Tobacco Use  Smoking Status Never  Smokeless Tobacco Never    Social History   Socioeconomic History   Marital status: Married  Tobacco Use   Smoking status: Never   Smokeless tobacco: Never  Vaping Use   Vaping Use: Never used  Substance and Sexual Activity   Alcohol use: Not Currently   Drug use: Never   Objective:   Vitals:  03/10/21 1429  Pulse: 88  Temp: 36.8 C (98.3 F)  SpO2: 98%  Weight: 72.8 kg (160 lb 6.4 oz)  Height: 170.2 cm (5\' 7" )   Body mass index is 25.12 kg/m.  Physical Exam  General nad Cv regular Pulm effort normal Breast no mass noted, no dc, no ax adenopathy  Assessment and Plan:   Malignant neoplasm of upper-inner quadrant of right breast in female, estrogen receptor positive (CMS-HCC)   Right ssm, right ax sn biopsy with expander reconstruction  We discussed skin sparing mastectomy and sentinel node biopsy today combined with expander reconstruction. We discussed surgery as well as recovery. Were going to proceed next week.

## 2021-03-20 NOTE — Anesthesia Procedure Notes (Signed)
Anesthesia Regional Block: Pectoralis block   Pre-Anesthetic Checklist: , timeout performed,  Correct Patient, Correct Site, Correct Laterality,  Correct Procedure, Correct Position, site marked,  Risks and benefits discussed,  Surgical consent,  Pre-op evaluation,  At surgeon's request and post-op pain management  Laterality: Right  Prep: chloraprep       Needles:  Injection technique: Single-shot  Needle Type: Stimiplex     Needle Length: 9cm  Needle Gauge: 21     Additional Needles:   Procedures:,,,, ultrasound used (permanent image in chart),,    Narrative:  Start time: 03/20/2021 7:09 AM End time: 03/20/2021 7:14 AM Injection made incrementally with aspirations every 5 mL.  Performed by: Personally  Anesthesiologist: Lynda Rainwater, MD

## 2021-03-21 DIAGNOSIS — D0511 Intraductal carcinoma in situ of right breast: Secondary | ICD-10-CM | POA: Diagnosis not present

## 2021-03-21 MED ORDER — ENOXAPARIN SODIUM 40 MG/0.4ML IJ SOSY
40.0000 mg | PREFILLED_SYRINGE | INTRAMUSCULAR | Status: DC
  Administered 2021-03-21: 08:00:00 40 mg via SUBCUTANEOUS

## 2021-03-21 NOTE — Discharge Summary (Signed)
Physician Discharge Summary  Patient ID: Janet Snyder MRN: [id number] DOB/AGE: 45/09/1975 45 y.o.  Admit date: 03/20/2021 Discharge date: 03/21/2021  Admission Diagnoses: Right breast cancer  Discharge Diagnoses:  Principal Problem:   Breast cancer, right [facility])   Discharged Condition: stable  Hospital Course: Post operatively patient did well tolerating diet, ambulatory with minimal assist, and pain controlled with oral medication. Patient instructed on drain care bathing and compression   Treatments: surgery: right mastectomy sentinel node right breast reconstruction with tissue expander acellular dermis 12.23.22  Discharge Exam: Blood pressure 113/68, pulse 65, temperature 98.3 F (36.8 C), resp. rate 18, height 5\' 7"  (1.702 m), weight 71.8 kg, last menstrual period 02/17/2021, SpO2 100 %. Incision/Wound: chest soft incisions dry intact Tegaderms in place drain serosanguinous  Disposition: Discharge disposition: 01-Home or Self Care       Discharge Instructions     Call MD for:  redness, tenderness, or signs of infection (pain, swelling, bleeding, redness, odor or green/yellow discharge around incision site)   Complete by: As directed    Call MD for:  temperature >100.5   Complete by: As directed    Discharge instructions   Complete by: As directed    Ok to remove dressings and shower am 12.25.22 Soap and water ok, pat Tegaderms dry. Do not remove Tegaderms. No creams or ointments over incisions. Do not let drains dangle in shower, attach to lanyard or similar.Strip and record drains twice daily and bring log to clinic visit.  Breast binder or soft compression bra all other times.  Ok to raise arms above shoulders for bathing and dressing.  No house yard work or exercise until cleared by MD.   Patient received all Rx preop. Recommend ibuprofen with meals to aid with pain control. Also ok to use Tylenol for pain. Recommend Miralax or Dulcolax as needed for  constipation.   Driving Restrictions   Complete by: As directed    No driving for 2 weeks then no driving if taking prescription pain medication   Lifting restrictions   Complete by: As directed    No lifting > 5-10 lb until cleared by MD   Resume previous diet   Complete by: As directed       Allergies as of 03/21/2021       Reactions   Sulfonamide Derivatives         Medication List     TAKE these medications    multivitamin with minerals tablet Take 1 tablet by mouth daily.   vitamin E 1000 UNIT capsule Take 1,000 Units by mouth daily.        Follow-up Information     Irene Limbo, MD Follow up in 1 week(s).   Specialty: Plastic Surgery Why: as scheduled Contact information: [address] [place] [address] [phone]         Rolm Bookbinder, MD. Schedule an appointment as soon as possible for a visit in 2 week(s).   Specialty: General Surgery Contact information: [address] [place] [address] [phone]                 Signed: Irene Limbo 03/21/2021, 7:24 AM

## 2021-03-21 NOTE — Discharge Instructions (Signed)

## 2021-03-24 ENCOUNTER — Encounter (HOSPITAL_BASED_OUTPATIENT_CLINIC_OR_DEPARTMENT_OTHER): Payer: Self-pay | Admitting: General Surgery

## 2021-03-26 ENCOUNTER — Encounter: Payer: Self-pay | Admitting: *Deleted

## 2021-03-29 LAB — SURGICAL PATHOLOGY

## 2021-03-31 ENCOUNTER — Encounter: Payer: Self-pay | Admitting: *Deleted

## 2021-03-31 ENCOUNTER — Telehealth: Payer: Self-pay | Admitting: *Deleted

## 2021-03-31 NOTE — Telephone Encounter (Signed)
Ordered oncotype per Dr. Feng.  Faxed requisition to pathology.   

## 2021-04-10 ENCOUNTER — Telehealth: Payer: Self-pay | Admitting: *Deleted

## 2021-04-10 ENCOUNTER — Encounter: Payer: Self-pay | Admitting: *Deleted

## 2021-04-10 NOTE — Telephone Encounter (Signed)
Received oncotype results 13/4%.  Left VM for a return phone call to give oncotype results.

## 2021-04-13 ENCOUNTER — Telehealth: Payer: Self-pay | Admitting: Hematology

## 2021-04-13 NOTE — Telephone Encounter (Signed)
Sch per 1/13 inbasket, pt aware °

## 2021-04-14 ENCOUNTER — Encounter (HOSPITAL_COMMUNITY): Payer: Self-pay

## 2021-04-14 ENCOUNTER — Encounter: Payer: Self-pay | Admitting: *Deleted

## 2021-04-17 ENCOUNTER — Inpatient Hospital Stay: Payer: Managed Care, Other (non HMO) | Attending: Hematology and Oncology | Admitting: Hematology

## 2021-04-17 ENCOUNTER — Other Ambulatory Visit: Payer: Self-pay

## 2021-04-17 VITALS — BP 108/71 | HR 78 | Temp 98.1°F | Resp 16 | Ht 67.0 in | Wt 161.5 lb

## 2021-04-17 DIAGNOSIS — Z170 Estrogen receptor positive status [ER+]: Secondary | ICD-10-CM | POA: Diagnosis present

## 2021-04-17 DIAGNOSIS — D696 Thrombocytopenia, unspecified: Secondary | ICD-10-CM | POA: Diagnosis not present

## 2021-04-17 DIAGNOSIS — Z803 Family history of malignant neoplasm of breast: Secondary | ICD-10-CM | POA: Diagnosis not present

## 2021-04-17 DIAGNOSIS — C50211 Malignant neoplasm of upper-inner quadrant of right female breast: Secondary | ICD-10-CM | POA: Diagnosis present

## 2021-04-17 MED ORDER — TAMOXIFEN CITRATE 20 MG PO TABS: 20.0000 mg | ORAL_TABLET | Freq: Every day | ORAL | 5 refills | Status: DC

## 2021-04-17 NOTE — Progress Notes (Signed)
[place]   [phone] [phone]   Clinic Follow up Note   Patient Care Team: Pcp, No as PCP - General Mauro Kaufmann, RN as Oncology Nurse Navigator Rockwell Germany, RN as Oncology Nurse Navigator Rolm Bookbinder, MD as Consulting Physician (General Surgery) Truitt Merle, MD as Consulting Physician (Hematology) Gery Pray, MD as Consulting Physician (Radiation Oncology)  Date of Service:  04/17/2021  CHIEF COMPLAINT: f/u of right breast cancer  CURRENT THERAPY:  To start Tamoxifen, prescribed 04/17/21  ASSESSMENT & PLAN:  Janet Snyder is a 46 y.o. female with   1. Malignant neoplasm of upper-inner quadrant of right breast, Stage IA, p(T1c, N0), ER+/PR+/HER2-, Grade 2  -found on screening mammogram. Right diagnostic MM and [place] on 01/29/21 showed 1.4 cm right breast mass at 1 o'clock with distortion and calcifications (total 8cm area). 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 -genetic testing results were negative. -breast MRI 02/13/21 showed additional area of non-mass enhancement in right breast. 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 -she proceeded to right mastectomy on 02/18/21 with Dr. Donne Hazel. 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 -Oncotype DX was obtained on the final surgical sample and the recurrence score of 13 predicts a risk of recurrence outside the breast over the next 9 years of 4%, if the patient's only systemic therapy is an antiestrogen for 5 years.  It also predicts no benefit from chemotherapy. I reviewed with her in detail  -Giving her strongly ER and PR positivity of the tumor cells, and her pre-menopause status, I recommend adjuvant endocrine therapy with tamoxifen. The potential side effects, which includes but not limited to, hot flash, skin and vaginal dryness, slightly increased risk of cardiovascular disease and cataract, small risk of thrombosis and endometrial cancer, were discussed with her in great  details. Preventive strategies for thrombosis, such as being physically active, using compression stocks, avoid cigarette smoking, etc., were reviewed with her. She voiced good understanding, and agrees to proceed.  -I also discussed the benefit of ovarian suppression and aromatase inhibitor, I discussed Zoladex injection versus BSO.  Given the overall low risk disease, and small benefit, she declined ovarian suppression.  If she became postmenopausal in the next 5 years, I would consider switching tamoxifen to AI. -We also discussed the breast cancer surveillance after her surgery. She will continue annual screening mammogram, self exam, and a routine office visit with lab and exam with [place]. -I encouraged her to have healthy diet and exercise regularly.    2. Thrombocytopenia -incidentally noted, mild  -slow decline in platelet count since 2013, down to 117k today (02/11/21). We will monitor. -possible ITP      PLAN:  -I prescribed tamoxifen for her to start.,she declined ovarian suppression  -survivorship in 3 months -lab and f/u in 6 months   No problem-specific Assessment & Plan notes found for this encounter.   SUMMARY OF ONCOLOGIC HISTORY: Oncology History Overview Note  Cancer Staging Malignant neoplasm of upper-inner quadrant of right breast in female, estrogen receptor positive ([place]) Staging form: Breast, AJCC 8th Edition - Clinical stage from 02/03/2021: Stage IA (cT1c, cN0, cM0, G2, ER+, PR+, HER2-) - Signed by Truitt Merle, MD on 02/10/2021    Malignant neoplasm of upper-inner quadrant of right breast in female, estrogen receptor positive ([place])  01/29/2021 Mammogram   EXAM: DIGITAL DIAGNOSTIC UNILATERAL RIGHT MAMMOGRAM WITH TOMOSYNTHESIS AND CAD; ULTRASOUND RIGHT BREAST LIMITED  IMPRESSION: Highly suspicious right breast mass measuring 13 x 14  x 12 mm at 1 o'clock with associated distortion. There are calcifications in the mass. Calcifications are seen anterior and posterior  to the mass as well.   02/03/2021 Cancer Staging   Staging form: Breast, AJCC 8th Edition - Clinical stage from 02/03/2021: Stage IA (cT1c, cN0, cM0, G2, ER+, PR+, HER2-) - Signed by Truitt Merle, MD on 02/10/2021 Stage prefix: Initial diagnosis Histologic grading system: 3 grade system    02/03/2021 Pathology Results   Diagnosis Breast, right, needle core biopsy, 1 o'clock mass, ribbon clip - INVASIVE MAMMARY CARCINOMA - MAMMARY CARCINOMA IN SITU - SEE COMMENT Microscopic Comment The biopsy material shows an infiltrative proliferation of cells arranged linearly and in small clusters. Based on the biopsy, the carcinoma appears Nottingham grade 2 of 3 and measures 1.1 cm in greatest linear extent.  E-cadherin is POSITIVE supporting a ductal origin.  PROGNOSTIC INDICATORS Results: The tumor cells are EQUIVOCAL for Her2 (2+). Her2 by FISH will be performed and results reported separately. Estrogen Receptor: 100%, POSITIVE, STRONG STAINING INTENSITY Progesterone Receptor: 100%, POSITIVE, STRONG STAINING INTENSITY Proliferation Marker Ki67: 5%  FLUORESCENCE IN-SITU HYBRIDIZATION Results: GROUP 5: HER2 **NEGATIVE**   02/06/2021 Initial Diagnosis   Malignant neoplasm of upper-inner quadrant of right breast in female, estrogen receptor positive ([place])   02/21/2021 Genetic Testing   Negative hereditary cancer genetic testing: no pathogenic variants detected in [facility] CustomNext-Cancer +RNAinsight Panel.  The report date is February 21, 2021.   The CustomNext-Cancer+RNAinsight panel offered by [facility] includes sequencing and rearrangement analysis for the following 47 genes:  APC, ATM, AXIN2, BARD1, BMPR1A, BRCA1, BRCA2, BRIP1, CDH1, CDK4, CDKN2A, CHEK2, DICER1, EPCAM, GREM1, HOXB13, MEN1, MLH1, MSH2, MSH3, MSH6, MUTYH, NBN, NF1, NF2, NTHL1, PALB2, PMS2, POLD1, POLE, PTEN, RAD51C, RAD51D, RECQL, RET, SDHA, SDHAF2, SDHB, SDHC, SDHD, SMAD4, SMARCA4, STK11, TP53, TSC1, TSC2, and VHL.  RNA  data is routinely analyzed for use in variant interpretation for all genes.    03/20/2021 Cancer Staging   Staging form: Breast, AJCC 8th Edition - Pathologic stage from 03/20/2021: Stage IA (pT1c, pN0, cM0, G2, ER+, PR+, HER2-) - Signed by Truitt Merle, MD on 04/17/2021 Stage prefix: Initial diagnosis Multigene prognostic tests performed: Oncotype DX DCIS Histologic grading system: 3 grade system Residual tumor (R): R0 - None       INTERVAL HISTORY:  Janet Snyder is here for a follow up of breast cancer. She was last seen by me on 02/11/21. She presents to the clinic accompanied by her son. She reports surgery went well and her recovery has also been very good.   All other systems were reviewed with the patient and are negative.  MEDICAL HISTORY:  Past Medical History:  Diagnosis Date   Cancer ([place]) 02/12/2021   breast   Family history of breast cancer 02/13/2021   Palpitations 05/23/2019   PONV (postoperative nausea and vomiting)    SOB (shortness of breath)     SURGICAL HISTORY: Past Surgical History:  Procedure Laterality Date   BREAST RECONSTRUCTION WITH PLACEMENT OF TISSUE EXPANDER AND ALLODERM Right 03/20/2021   Procedure: RIGHT BREAST RECONSTRUCTION WITH PLACEMENT OF TISSUE EXPANDER AND ALLODERM;  Surgeon: Irene Limbo, MD;  Location: [facility];  Service: Plastics;  Laterality: Right;   CESAREAN SECTION     CHOLECYSTECTOMY N/A 06/13/2012   Procedure: LAPAROSCOPIC CHOLECYSTECTOMY WITH INTRAOPERATIVE CHOLANGIOGRAM;  Surgeon: Earnstine Regal, MD;  Location: [facility];  Service: General;  Laterality: N/A;   FOOT SURGERY     MASTECTOMY W/ SENTINEL NODE  BIOPSY Right 03/20/2021   Procedure: RIGHT MASTECTOMY WITH RIGHT AXILLARY SENTINEL LYMPH NODE BIOPSY;  Surgeon: Rolm Bookbinder, MD;  Location: [facility];  Service: General;  Laterality: Right;    I have reviewed the social history and family history with the patient and they are  unchanged from previous note.  ALLERGIES:  is allergic to sulfonamide derivatives.  MEDICATIONS:  Current Outpatient Medications  Medication Sig Dispense Refill   Multiple Vitamins-Minerals (MULTIVITAMIN WITH MINERALS) tablet Take 1 tablet by mouth daily.     vitamin E 1000 UNIT capsule Take 1,000 Units by mouth daily.     No current facility-administered medications for this visit.    PHYSICAL EXAMINATION: ECOG PERFORMANCE STATUS: 0 - Asymptomatic  There were no vitals filed for this visit. Wt Readings from Last 3 Encounters:  03/20/21 158 lb 4.6 oz (71.8 kg)  02/11/21 161 lb 14.4 oz (73.4 kg)  06/13/12 163 lb 5.8 oz (74.1 kg)     GENERAL:alert, no distress and comfortable SKIN: skin color, texture, turgor are normal, no rashes or significant lesions EYES: normal, Conjunctiva are pink and non-injected, sclera clear  NECK: supple, thyroid normal size, non-tender, without nodularity LYMPH:  no palpable lymphadenopathy in the cervical, axillary  LUNGS: clear to auscultation and percussion with normal breathing effort HEART: regular rate & rhythm and no murmurs and no lower extremity edema ABDOMEN:abdomen soft, non-tender and normal bowel sounds Musculoskeletal:no cyanosis of digits and no clubbing  NEURO: alert & oriented x 3 with fluent speech, no focal motor/sensory deficits BREAST: Status post right mastectomy and tissue expander placement, the incision is healing well.  No palpable mass, nodules or adenopathy bilaterally. Breast exam benign.   LABORATORY DATA:  I have reviewed the data as listed CBC Latest Ref Rng & Units 02/11/2021 06/13/2012 01/18/2012  WBC 4.0 - 10.5 K/uL 4.7 9.7 10.7(H)  Hemoglobin 12.0 - 15.0 g/dL 12.0 12.2 11.9(L)  Hematocrit 36.0 - 46.0 % 35.2(L) 36.3 35.9(L)  Platelets 150 - 400 K/uL 117(L) 134(L) 146(L)     CMP Latest Ref Rng & Units 02/11/2021 06/13/2012 01/18/2012  Glucose 70 - 99 mg/dL 84 111(H) 120(H)  BUN 6 - 20 mg/dL '12 13 9  ' Creatinine  0.44 - 1.00 mg/dL 0.85 0.80 0.80  Sodium 135 - 145 mmol/L 140 137 136  Potassium 3.5 - 5.1 mmol/L 4.1 3.4(L) 3.5  Chloride 98 - 111 mmol/L 110 104 100  CO2 22 - 32 mmol/L '23 22 25  ' Calcium 8.9 - 10.3 mg/dL 8.9 9.4 8.9  Total Protein 6.5 - 8.1 g/dL 6.9 7.4 7.5  Total Bilirubin 0.3 - 1.2 mg/dL 0.5 0.3 0.3  Alkaline Phos 38 - 126 U/L 40 49 52  AST 15 - 41 U/L 14(L) 14 15  ALT 0 - 44 U/L '10 12 9      ' RADIOGRAPHIC STUDIES: I have personally reviewed the radiological images as listed and agreed with the findings in the report. No results found.    No orders of the defined types were placed in this encounter.  All questions were answered. The patient knows to call the clinic with any problems, questions or concerns. No barriers to learning was detected. The total time spent in the appointment was 40 minutes.     Truitt Merle, MD 04/17/2021   I, Wilburn Mylar, am acting as scribe for Truitt Merle, MD.   I have reviewed the above documentation for accuracy and completeness, and I agree with the above.

## 2021-04-18 ENCOUNTER — Encounter: Payer: Self-pay | Admitting: Hematology

## 2021-04-20 ENCOUNTER — Ambulatory Visit: Payer: Managed Care, Other (non HMO) | Attending: General Surgery | Admitting: Physical Therapy

## 2021-04-20 ENCOUNTER — Other Ambulatory Visit: Payer: Self-pay

## 2021-04-20 ENCOUNTER — Encounter: Payer: Self-pay | Admitting: Physical Therapy

## 2021-04-20 ENCOUNTER — Encounter: Payer: Self-pay | Admitting: *Deleted

## 2021-04-20 DIAGNOSIS — C50211 Malignant neoplasm of upper-inner quadrant of right female breast: Secondary | ICD-10-CM

## 2021-04-20 DIAGNOSIS — Z170 Estrogen receptor positive status [ER+]: Secondary | ICD-10-CM

## 2021-04-20 DIAGNOSIS — Z483 Aftercare following surgery for neoplasm: Secondary | ICD-10-CM | POA: Diagnosis present

## 2021-04-20 DIAGNOSIS — R293 Abnormal posture: Secondary | ICD-10-CM | POA: Insufficient documentation

## 2021-04-20 NOTE — Therapy (Signed)
[facility] [place], [place], [address] Phone: [phone]   Fax:  [phone]  Physical Therapy Treatment  Patient Details  Name: Janet Snyder MRN: [id number] Date of Birth: 1976/01/11 Referring Provider (PT): Dr. Rolm Bookbinder   Encounter Date: 04/20/2021   PT End of Session - 04/20/21 1343     Visit Number 1    Number of Visits 3    Date for PT Re-Evaluation 05/18/21    PT Start Time 8242    PT Stop Time 1343    PT Time Calculation (min) 40 min    Activity Tolerance Patient tolerated treatment well    Behavior During Therapy [facility] for tasks assessed/performed             Past Medical History:  Diagnosis Date   Cancer ([place]) 02/12/2021   breast   Family history of breast cancer 02/13/2021   Palpitations 05/23/2019   PONV (postoperative nausea and vomiting)    SOB (shortness of breath)     Past Surgical History:  Procedure Laterality Date   BREAST RECONSTRUCTION WITH PLACEMENT OF TISSUE EXPANDER AND ALLODERM Right 03/20/2021   Procedure: RIGHT BREAST RECONSTRUCTION WITH PLACEMENT OF TISSUE EXPANDER AND ALLODERM;  Surgeon: Irene Limbo, MD;  Location: [facility];  Service: Plastics;  Laterality: Right;   CESAREAN SECTION     CHOLECYSTECTOMY N/A 06/13/2012   Procedure: LAPAROSCOPIC CHOLECYSTECTOMY WITH INTRAOPERATIVE CHOLANGIOGRAM;  Surgeon: Earnstine Regal, MD;  Location: [facility];  Service: General;  Laterality: N/A;   FOOT SURGERY     MASTECTOMY W/ SENTINEL NODE BIOPSY Right 03/20/2021   Procedure: RIGHT MASTECTOMY WITH RIGHT AXILLARY SENTINEL LYMPH NODE BIOPSY;  Surgeon: Rolm Bookbinder, MD;  Location: [facility];  Service: General;  Laterality: Right;    There were no vitals filed for this visit.   Subjective Assessment - 04/20/21 1306     Subjective Patient reports she underwent a right mastectomy with an expander placed and sentinel node biopsy (4  negative nodes) on 03/20/2021. Her Oncotype score was low so no chemo needed. No radiation needed but will continue on anti-estrogen therapy x10 years.    Pertinent History Patient was diagnosed on 01/21/2021 with right grade II invasive ductal carcinoma breast cancer. She underwent a right mastectomy and sentinel node biopsy (4 negative nodes) on 03/20/2021. It is ER/PR positive and HER2 negative with a Ki67 of 5%.    Patient Stated Goals See how my arm is doing    Currently in Pain? No/denies                [facility] PT Assessment - 04/20/21 0001       Assessment   Medical Diagnosis s/p right mastectomy with reconstruction and SLNB    Referring Provider (PT) Dr. Rolm Bookbinder    Onset Date/Surgical Date 03/20/21    Hand Dominance Right    Prior Therapy Baselines      Precautions   Precautions Other (comment)    Precaution Comments right arm lymphedema risk      Restrictions   Weight Bearing Restrictions No      Balance Screen   Has the patient fallen in the past 6 months No    Has the patient had a decrease in activity level because of a fear of falling?  No    Is the patient reluctant to leave their home because of a fear of falling?  No  Home Environment   Living Environment Private residence    Living Arrangements Spouse/significant other;Children   Husband, 58, 6, and 36 y.o. 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 No axillary cording noted and no significant edema noted.      Posture/Postural  Control   Posture/Postural Control Postural limitations    Postural Limitations Rounded Shoulders;Forward head      ROM / Strength   AROM / PROM / Strength AROM      AROM   AROM Assessment Site Shoulder    Right/Left Shoulder Right    Right Shoulder Extension 66 Degrees    Right Shoulder Flexion 113 Degrees    Right Shoulder ABduction 130 Degrees    Right Shoulder Internal Rotation 57 Degrees    Right Shoulder External Rotation 85 Degrees      Strength   Overall Strength Within functional limits for tasks performed               LYMPHEDEMA/ONCOLOGY QUESTIONNAIRE - 04/20/21 0001       Type   Cancer Type Right breast cancer      Surgeries   Mastectomy Date 03/20/21    Sentinel Lymph Node Biopsy Date 03/20/21    Number Lymph Nodes Removed 4      Treatment   Active Chemotherapy Treatment No    Past Chemotherapy Treatment No    Active Radiation Treatment No    Past Radiation Treatment No    Current Hormone Treatment Yes    Date 03/17/22    Drug Name Tamoxifen    Past Hormone Therapy No      What other symptoms do you have   Are you Having Heaviness or Tightness No    Are you having Pain No    Are you having pitting edema No    Is it Hard or Difficult finding clothes that fit No    Do you have infections No    Is there Decreased scar mobility Yes    [place] Sign No      Lymphedema Assessments   Lymphedema Assessments Upper extremities      Right Upper Extremity Lymphedema   10 cm Proximal to Olecranon Process 28.5 cm    Olecranon Process 25.8 cm    10 cm Proximal to Ulnar Styloid Process 21.1 cm    Just Proximal to Ulnar Styloid Process 16.2 cm    Across Hand at [employer] 20.3 cm    At [employer] of 2nd Digit 6.2 cm      Left Upper Extremity Lymphedema   10 cm Proximal to Olecranon Process 28.7 cm    Olecranon Process 25 cm    10 cm Proximal to Ulnar Styloid Process 20.8 cm    Just Proximal to Ulnar Styloid Process 15.7 cm    Across Hand at [employer] 19.7 cm    At [place] of 2nd Digit 6.2 cm  Katina Dung - 04/20/21 0001     Open a tight or new jar No difficulty    Do heavy household chores (wash walls, wash floors) Moderate difficulty    Carry a shopping bag or briefcase No difficulty    Wash your back Mild difficulty    Use a knife to cut food No difficulty    Recreational activities in which you take some force or impact through your arm, shoulder, or hand (golf, hammering, tennis) Unable    During the past week, to what extent has your arm, shoulder or hand problem interfered with your normal social activities with family, friends, neighbors, or groups? Not at all    During the past week, to what extent has your arm, shoulder or hand problem limited your work or other regular daily activities Modererately    Arm, shoulder, or hand pain. Moderate    Tingling (pins and needles) in your arm, shoulder, or hand Mild    Difficulty Sleeping No difficulty    DASH Score 27.27 %                             PT Education - 04/20/21 1347     Education Details Post op HEP, aftercare, scar massage for drain site    Person(s) Educated Patient    Methods Explanation;Demonstration;Handout    Comprehension Returned demonstration;Verbalized understanding                 PT Long Term Goals - 04/20/21 1404       PT LONG TERM GOAL #1   Title Patient will demonstrate she has regained full shoulder ROM and function post operatively compared to baselines.    Time 4    Period Weeks    Status Partially Met    Target Date 05/18/21                   Plan - 04/20/21 1347     Clinical Impression Statement Patient is doing well s/p right mastectomy and sentinel node biopsy (4 negative nodes) with an expander placed for reconstruction on 03/20/2021. She does not need further treatment for her cancer except anti-estrogen therapy. Her incisions are healing well and she has no significant edema.  She is significantly limited with shoulder ROM but has not yet tried the HEP. She would benefit from PT but would like ot try the HEP for 1 month and return for a reassessment. If at that time, she sitll has not gained normal ROM, we can begin PT.    PT Frequency --   1 f/u visit in 1 month and then will reassess needs   PT Treatment/Interventions ADLs/Self Care Home Management;Therapeutic exercise;Patient/family education    PT Next Visit Plan Will reassess shoulder ROM and incision in 1 month to determine need for PT    PT Home Exercise Plan Post op HEP    Consulted and Agree with Plan of Care Patient             Patient will benefit from skilled therapeutic intervention in order to improve the following deficits and impairments:  Postural dysfunction, Decreased range of motion, Decreased knowledge of precautions, Impaired UE functional use, Pain, Decreased scar mobility  Visit Diagnosis: Malignant neoplasm of upper-inner quadrant of right breast in female, estrogen receptor positive ([place]) - Plan: PT plan of care cert/re-cert  Abnormal posture - Plan: PT plan of care cert/re-cert  Aftercare following surgery for  neoplasm - Plan: PT plan of care cert/re-cert     Problem List Patient Active Problem List   Diagnosis Date Noted   Genetic testing 02/23/2021   Family history of breast cancer 02/13/2021   Malignant neoplasm of upper-inner quadrant of right breast in female, estrogen receptor positive ([place]) 02/06/2021   Cholelithiasis with cholecystitis 06/13/2012   Annia Friendly, PT 04/20/21 2:06 PM   [facility] @ [facility] [facility] [place], [place], [address] Phone: [phone]   Fax:  [phone]  Name: Janet Snyder MRN: [id number] Date of Birth: 1975-09-28

## 2021-04-20 NOTE — Patient Instructions (Addendum)
° ° °   [facility]  [address], [address]  [place] [address]  [phone]  After Breast Cancer Class It is recommended you attend the ABC class to be educated on lymphedema risk reduction. This class is free of charge and lasts for 1 hour. It is a 1-time class. You will need to download the Webex app either on your phone or computer. We will send you a link the night before or the morning of the class. You should be able to click on that link to join the class. This is not a confidential class. You don't have to turn your camera on, but other participants may be able to see your email address. You are scheduled for February 6th at 11:00.  Scar massage You can begin gentle scar massage to you incision sites. Gently place one hand on the incision and move the skin (without sliding on the skin) in various directions. Do this for a few minutes and then you can gently massage either coconut oil or vitamin E cream into the scars. BEGIN THIS ON YOUR DRAIN TUBE SCARS BUT NOT ON YOUR BREAST INCISIONS YET.  Compression garment You should continue wearing your compression bra until you feel like you no longer have swelling.  Home exercise Program Continue doing the exercises you were given until you feel like you can do them without feeling any tightness at the end.   Walking Program Studies show that 30 minutes of walking per day (fast enough to elevate your heart rate) can significantly reduce the risk of a cancer recurrence. If you can't walk due to other medical reasons, we encourage you to find another activity you could do (like a stationary bike or water exercise).  Posture After breast cancer surgery, people frequently sit with rounded shoulders posture because it puts their incisions on slack and feels better. If you sit like this and scar tissue forms in that position, you can become very tight and have pain sitting or standing with good posture. Try to be aware of  your posture and sit and stand up tall to heal properly.  Follow up PT: It is recommended you return every 3 months for the first 3 years following surgery to be assessed on the SOZO machine for an L-Dex score. This helps prevent clinically significant lymphedema in 95% of patients. These follow up screens are 10 minute appointments that you are not billed for. You are scheduled for April 10th at 8:00.

## 2021-04-23 ENCOUNTER — Ambulatory Visit: Payer: Managed Care, Other (non HMO) | Admitting: Hematology and Oncology

## 2021-05-07 ENCOUNTER — Encounter: Payer: Self-pay | Admitting: Hematology

## 2021-05-18 ENCOUNTER — Other Ambulatory Visit: Payer: Self-pay

## 2021-05-18 ENCOUNTER — Encounter: Payer: Self-pay | Admitting: Physical Therapy

## 2021-05-18 ENCOUNTER — Ambulatory Visit: Payer: Managed Care, Other (non HMO) | Attending: General Surgery | Admitting: Physical Therapy

## 2021-05-18 DIAGNOSIS — C50211 Malignant neoplasm of upper-inner quadrant of right female breast: Secondary | ICD-10-CM | POA: Diagnosis present

## 2021-05-18 DIAGNOSIS — Z483 Aftercare following surgery for neoplasm: Secondary | ICD-10-CM | POA: Diagnosis not present

## 2021-05-18 DIAGNOSIS — R293 Abnormal posture: Secondary | ICD-10-CM | POA: Insufficient documentation

## 2021-05-18 DIAGNOSIS — Z170 Estrogen receptor positive status [ER+]: Secondary | ICD-10-CM | POA: Insufficient documentation

## 2021-05-18 NOTE — Therapy (Signed)
[facility] @ [address] [place], [place], [address] Phone: [phone]   Fax:  [phone]  Physical Therapy Treatment  Patient Details  Name: Janet Snyder MRN: [id number] Date of Birth: January 30, 1976 Referring Provider (PT): Dr. Rolm Bookbinder   Encounter Date: 05/18/2021   PT End of Session - 05/18/21 1526     Visit Number 3    Number of Visits 3    PT Start Time 1510    PT Stop Time 1523    PT Time Calculation (min) 13 min    Activity Tolerance Patient tolerated treatment well    Behavior During Therapy [facility] for tasks assessed/performed             Past Medical History:  Diagnosis Date   Cancer ([place]) 02/12/2021   breast   Family history of breast cancer 02/13/2021   Palpitations 05/23/2019   PONV (postoperative nausea and vomiting)    SOB (shortness of breath)     Past Surgical History:  Procedure Laterality Date   BREAST RECONSTRUCTION WITH PLACEMENT OF TISSUE EXPANDER AND ALLODERM Right 03/20/2021   Procedure: RIGHT BREAST RECONSTRUCTION WITH PLACEMENT OF TISSUE EXPANDER AND ALLODERM;  Surgeon: Irene Limbo, MD;  Location: [facility];  Service: Plastics;  Laterality: Right;   CESAREAN SECTION     CHOLECYSTECTOMY N/A 06/13/2012   Procedure: LAPAROSCOPIC CHOLECYSTECTOMY WITH INTRAOPERATIVE CHOLANGIOGRAM;  Surgeon: Earnstine Regal, MD;  [facility];  Service: General;  Laterality: N/A;   FOOT SURGERY     MASTECTOMY W/ SENTINEL NODE BIOPSY Right 03/20/2021   Procedure: RIGHT MASTECTOMY WITH RIGHT AXILLARY SENTINEL LYMPH NODE BIOPSY;  Surgeon: Rolm Bookbinder, MD;  Location: [facility];  Service: General;  Laterality: Right;    There were no vitals filed for this visit.   Subjective Assessment - 05/18/21 1515     Subjective She reports she is doing well and feels like she has regained full shoulder ROM. She is running 2.5-3 miles 5x/week. She is here to have her  shoulder ROM rechecked to be sure she's gotten back to baseline.    Pertinent History Patient was diagnosed on 01/21/2021 with right grade II invasive ductal carcinoma breast cancer. She underwent a right mastectomy and sentinel node biopsy (4 negative nodes) on 03/20/2021. It is ER/PR positive and HER2 negative with a Ki67 of 5%.    Currently in Pain? No/denies                [facility] PT Assessment - 05/18/21 0001       Assessment   Medical Diagnosis s/p right mastectomy with reconstruction and SLNB    Referring Provider (PT) Dr. Rolm Bookbinder    Onset Date/Surgical Date 03/20/21    Hand Dominance Right    Prior Therapy Post op visit      Precautions   Precautions Other (comment)    Precaution Comments right arm lymphedema risk      Restrictions   Weight Bearing Restrictions No      Balance Screen   Has the patient fallen in the past 6 months No    Has the patient had a decrease in activity level because of a fear of falling?  No    Is the patient reluctant to leave their home because of a fear of falling?  No      Home Social worker Private residence    Living Arrangements Spouse/significant other;Children   Husband, 10, 27,  and 10 y.o. 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    Time 4    Period Weeks     Status Achieved                   Plan - 05/18/21 1528     Clinical Impression Statement Patient returned today after doing her post op HEP for 4 weeks to see if she has regained ROM compared to baselines. Her shoulder ROM measurements are within 7 degrees of baseline and she feels no pain or restriction. She has also returned to running 5 days/week. There is no further need for PT.    PT Treatment/Interventions ADLs/Self Care Home Management;Therapeutic exercise;Patient/family education    PT Next Visit Plan D/C - will see in April for SOZO screen    PT Home Exercise Plan Post op HEP    Consulted and Agree with Plan of Care Patient             Patient will benefit from skilled therapeutic intervention in order to improve the following deficits and impairments:  Postural dysfunction, Decreased range of motion, Decreased knowledge of precautions, Impaired UE functional use, Pain, Decreased scar mobility  Visit Diagnosis: Aftercare following surgery for neoplasm  Malignant neoplasm of upper-inner quadrant of right breast in female, estrogen receptor positive ([place])  Abnormal posture     Problem List Patient Active Problem List   Diagnosis Date Noted   Genetic testing 02/23/2021   Family history of breast cancer 02/13/2021   Malignant neoplasm of upper-inner quadrant of right breast in female, estrogen receptor positive ([place]) 02/06/2021   Cholelithiasis with cholecystitis 06/13/2012   PHYSICAL THERAPY DISCHARGE SUMMARY  Visits from Start of Care: 3  Current functional level related to goals / functional outcomes: Goals met. Pt is nearly at baseline with shoulder ROM measurements and right breast inferior incision appears to be completely healed.   Remaining deficits: None   Education / Equipment: HEP and lymphedema education.   Patient agrees to discharge. Patient goals were met. Patient is being discharged due to meeting the stated rehab goals.  Annia Friendly, [place] 05/18/21 3:34 PM    [facility] @ [address] [place], [place], [address] Phone: [phone]   Fax:  [phone]  Name: Janet Snyder MRN: [id number] Date of Birth: 02-26-1976

## 2021-07-06 ENCOUNTER — Ambulatory Visit: Payer: Managed Care, Other (non HMO) | Attending: General Surgery

## 2021-07-06 VITALS — Wt 159.5 lb

## 2021-07-06 DIAGNOSIS — Z483 Aftercare following surgery for neoplasm: Secondary | ICD-10-CM | POA: Insufficient documentation

## 2021-07-06 NOTE — Therapy (Signed)
?  OUTPATIENT PHYSICAL THERAPY SOZO SCREENING NOTE ? ? ?Patient Name: Janet Snyder ?MRN: [id number] ?DOB:1975/12/02, 46 y.o., female ?Today's Date: 07/06/2021 ? ?PCP: Pcp, No ?REFERRING PROVIDER: Rolm Bookbinder, MD ? ? PT End of Session - 07/06/21 0881   ? ? Visit Number 3   # unchanged due to screen only  ? PT Start Time [phone]   ? PT Stop Time 0809   ? PT Time Calculation (min) 5 min   ? Activity Tolerance Patient tolerated treatment well   ? Behavior During Therapy [facility] for tasks assessed/performed   ? ?  ?  ? ?  ? ? ?Past Medical History:  ?Diagnosis Date  ? Cancer ([place]) 02/12/2021  ? breast  ? Family history of breast cancer 02/13/2021  ? Palpitations 05/23/2019  ? PONV (postoperative nausea and vomiting)   ? SOB (shortness of breath)   ? ?Past Surgical History:  ?Procedure Laterality Date  ? BREAST RECONSTRUCTION WITH PLACEMENT OF TISSUE EXPANDER AND ALLODERM Right 03/20/2021  ? Procedure: RIGHT BREAST RECONSTRUCTION WITH PLACEMENT OF TISSUE EXPANDER AND ALLODERM;  Surgeon: Irene Limbo, MD;  Location: [facility];  Service: Plastics;  Laterality: Right;  ? CESAREAN SECTION    ? CHOLECYSTECTOMY N/A 06/13/2012  ? Procedure: LAPAROSCOPIC CHOLECYSTECTOMY WITH INTRAOPERATIVE CHOLANGIOGRAM;  Surgeon: Earnstine Regal, MD;  [facility];  Service: General;  Laterality: N/A;  ? FOOT SURGERY    ? MASTECTOMY W/ SENTINEL NODE BIOPSY Right 03/20/2021  ? Procedure: RIGHT MASTECTOMY WITH RIGHT AXILLARY SENTINEL LYMPH NODE BIOPSY;  Surgeon: Rolm Bookbinder, MD;  Location: [facility];  Service: General;  Laterality: Right;  ? ?Patient Active Problem List  ? Diagnosis Date Noted  ? Genetic testing 02/23/2021  ? Family history of breast cancer 02/13/2021  ? Malignant neoplasm of upper-inner quadrant of right breast in female, estrogen receptor positive ([place]) 02/06/2021  ? Cholelithiasis with cholecystitis 06/13/2012  ? ? ?REFERRING DIAG: right breast cancer at risk for  lymphedema ? ?THERAPY DIAG:  ?Aftercare following surgery for neoplasm ? ?PERTINENT HISTORY: Patient was diagnosed on 01/21/2021 with right grade II invasive ductal carcinoma breast cancer. She underwent a right mastectomy and sentinel node biopsy (4 negative nodes) on 03/20/2021. It is ER/PR positive and HER2 negative with a Ki67 of 5%.  ? ?PRECAUTIONS: right UE Lymphedema risk, None ? ?SUBJECTIVE: Pt returns for her 3 month L-Dex screen.  ? ?PAIN:  ?Are you having pain? No ? ?SOZO SCREENING: ?Patient was assessed today using the SOZO machine to determine the lymphedema index score. This was compared to her baseline score. It was determined that she is within the recommended range when compared to her baseline and no further action is needed at this time. She will continue SOZO screenings. These are done every 3 months for 2 years post operatively followed by every 6 months for 2 years, and then annually. ? ? ? ?Otelia Limes, PTA ?07/06/2021, 9:49 AM ? ?  ? ?

## 2021-07-09 ENCOUNTER — Telehealth: Payer: Self-pay | Admitting: *Deleted

## 2021-07-14 NOTE — Progress Notes (Signed)
? ?CLINIC:  ?Survivorship  ? ?Patient Care Team: ?Pcp, No as PCP - General ?Mauro Kaufmann, RN as Oncology Nurse Navigator ?Rockwell Germany, RN as Oncology Nurse Navigator ?Rolm Bookbinder, MD as Consulting Physician (General Surgery) ?Truitt Merle, MD as Consulting Physician (Hematology) ?Gery Pray, MD as Consulting Physician (Radiation Oncology) ?Alla Feeling, NP as Nurse Practitioner (Nurse Practitioner)  ? ?REASON FOR VISIT:  ?Routine follow-up post-treatment for a recent history of breast cancer. ? ?BRIEF ONCOLOGIC HISTORY:  ?Oncology History Overview Note  ?Cancer Staging ?Malignant neoplasm of upper-inner quadrant of right breast in female, estrogen receptor positive ([place]) ?Staging form: Breast, AJCC 8th Edition ?- Clinical stage from 02/03/2021: Stage IA (cT1c, cN0, cM0, G2, ER+, PR+, HER2-) - Signed by Truitt Merle, MD on 02/10/2021 ? ?  ?Malignant neoplasm of upper-inner quadrant of right breast in female, estrogen receptor positive ([place])  ?01/29/2021 Mammogram  ? EXAM: ?DIGITAL DIAGNOSTIC UNILATERAL RIGHT MAMMOGRAM WITH TOMOSYNTHESIS AND CAD; ULTRASOUND RIGHT BREAST LIMITED ? ?IMPRESSION: ?Highly suspicious right breast mass measuring 13 x 14 x 12 mm at 1 o'clock with associated distortion. There are calcifications in the mass. Calcifications are seen anterior and posterior to the mass as well. ?  ?02/03/2021 Cancer Staging  ? Staging form: Breast, AJCC 8th Edition ?- Clinical stage from 02/03/2021: Stage IA (cT1c, cN0, cM0, G2, ER+, PR+, HER2-) - Signed by Truitt Merle, MD on 02/10/2021 ?Stage prefix: Initial diagnosis ?Histologic grading system: 3 grade system ? ?  ?02/03/2021 Pathology Results  ? Diagnosis ?Breast, right, needle core biopsy, 1 o'clock mass, ribbon clip ?- INVASIVE MAMMARY CARCINOMA ?- MAMMARY CARCINOMA IN SITU ?- SEE COMMENT ?Microscopic Comment ?The biopsy material shows an infiltrative proliferation of cells arranged linearly and in small clusters. Based on the ?biopsy, the carcinoma  appears Nottingham grade 2 of 3 and measures 1.1 cm in greatest linear extent. ? ?E-cadherin is POSITIVE supporting a ductal origin. ? ?PROGNOSTIC INDICATORS ?Results: ?The tumor cells are EQUIVOCAL for Her2 (2+). Her2 by FISH will be performed and results reported separately. ?Estrogen Receptor: 100%, POSITIVE, STRONG STAINING INTENSITY ?Progesterone Receptor: 100%, POSITIVE, STRONG STAINING INTENSITY ?Proliferation Marker Ki67: 5% ? ?FLUORESCENCE IN-SITU HYBRIDIZATION ?Results: ?GROUP 5: HER2 **NEGATIVE** ?  ?02/06/2021 Initial Diagnosis  ? Malignant neoplasm of upper-inner quadrant of right breast in female, estrogen receptor positive ([place]) ? ?  ?02/13/2021 Imaging  ? EXAM: ?BILATERAL BREAST MRI WITH AND WITHOUT CONTRAST ? ?IMPRESSION: ?1. Biopsy-proven malignancy over the right upper inner quadrant ?measuring 1.6 x 1.8 x 1.2 cm. ?  ?2. Focal non mass enhancement over the middle third of the right ?outer lower quadrant measuring 8 x 10 x 7 mm. ?  ?3.  No suspicious findings within the left breast. ?  ?02/21/2021 Genetic Testing  ? Negative hereditary cancer genetic testing: no pathogenic variants detected in [facility] CustomNext-Cancer +RNAinsight Panel.  The report date is February 21, 2021.  ? ?The CustomNext-Cancer+RNAinsight panel offered by [facility] includes sequencing and rearrangement analysis for the following 47 genes:  APC, ATM, AXIN2, BARD1, BMPR1A, BRCA1, BRCA2, BRIP1, CDH1, CDK4, CDKN2A, CHEK2, DICER1, EPCAM, GREM1, HOXB13, MEN1, MLH1, MSH2, MSH3, MSH6, MUTYH, NBN, NF1, NF2, NTHL1, PALB2, PMS2, POLD1, POLE, PTEN, RAD51C, RAD51D, RECQL, RET, SDHA, SDHAF2, SDHB, SDHC, SDHD, SMAD4, SMARCA4, STK11, TP53, TSC1, TSC2, and VHL.  RNA data is routinely analyzed for use in variant interpretation for all genes. ? ?  ?02/23/2021 Pathology Results  ? Diagnosis ?Breast, right, needle core biopsy, lateral, cylinder clip ?- FOCAL PSEUDOANGIOMATOUS STROMAL HYPERPLASIA ?-  NO MALIGNANCY IDENTIFIED ?  ?03/20/2021  Cancer Staging  ? Staging form: Breast, AJCC 8th Edition ?- Pathologic stage from 03/20/2021: Stage IA (pT1c, pN0, cM0, G2, ER+, PR+, HER2-) - Signed by Truitt Merle, MD on 04/17/2021 ?Stage prefix: Initial diagnosis ?Multigene prognostic tests performed: Oncotype DX DCIS ?Histologic grading system: 3 grade system ?Residual tumor (R): R0 - None ? ?  ? Pathology Results  ? Diagnosis ?1. Breast, right, needle core biopsy, UIQ, posterior depth, x clip ?- DUCTAL CARCINOMA IN SITU WITH CALCIFICATIONS ?- SEE COMMENT ?2. Breast, right, needle core biopsy, UIQ, anterior, coil clip ?- DUCTAL CARCINOMA IN SITU ?- FIBROADENOMATOID AND FIBROCYSTIC CHANGES WITH MICROCALCIFICATIONS ?- SEE COMMENT ? ?Microscopic Comment ?1. Based on the biopsy, the ductal carcinoma in situ has a solid pattern, intermediate nuclear grade and measures 0.3 cm in greatest linear extent ? ?1. PROGNOSTIC INDICATORS ?Results: ?Estrogen Receptor: 100%, POSITIVE, STRONG STAINING INTENSITY ?Progesterone Receptor: 100%, POSITIVE, STRONG STAINING INTENSITY ?  ?03/20/2021 Definitive Surgery  ? FINAL MICROSCOPIC DIAGNOSIS:  ? ?A. BREAST, RIGHT, MASTECTOMY:  ?- Invasive ductal carcinoma, grade 2, 1.9 cm in maximal extent.  ?- Invasive tumor is 2.9 cm from deep margin.  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  ?- Biopsy site changes.  ? ?B. LYMPH NODE, SENTINEL, RIGHT AXILLARY, EXCISION:  ?- 1 lymph node, negative for carcinoma (0/1).  ? ?C. LYMPH NODE, SENTINEL, RIGHT AXILLARY, EXCISION:  ?- 1 lymph node, negative for carcinoma (0/1).  ? ?D. LYMPH NODE, SENTINEL, RIGHT AXILLARY, EXCISION:  ?- 1 lymph node, negative for carcinoma (0/1).  ? ?E. LYMPH NODE, SENTINEL, RIGHT AXILLARY, EXCISION:  ?- 1 lymph node, negative for carcinoma (0/1).  ?  ?03/2021 -  Anti-estrogen oral therapy  ? Began tamoxifen 20 mg p.o. once daily, plan for 10 years ?  ?07/15/2021 Survivorship  ? SCP delivered by Cira Rue, NP ?  ? ? ?INTERVAL HISTORY:  ?Ms. Janet Snyder presents to  the [facility] today for our initial meeting to review her survivorship care plan detailing her treatment course for breast cancer, as well as monitoring long-term side effects of that treatment, education regarding health maintenance, screening, and overall wellness and health promotion.    ? ?He has recovered well from surgery and has started tamoxifen, tolerating well except moderate night sweats that seem to have increased in the past couple weeks.  These are annoying but manageable, she prefers not to add another medication to manage this.  She continues having regular monthly menses, heavy for 2 days lasting a total of 5 days.  She is very active, exercises 5 days a week.  She continues working.  She has plastic surgery with Dr. Iran Planas planned for the next month. ? ? ? ?REVIEW OF SYSTEMS:  ?Review of Systems - Oncology ?Breast: Denies any new nodularity, masses, tenderness, nipple changes, or nipple discharge.  ? ? ? ? ?ONCOLOGY TREATMENT TEAM:  ?1. Surgeon:  Dr. Donne [facility]el at [facility] ?2. Medical Oncologist: Dr. Burr Medico  ?  ? ?PAST MEDICAL/SURGICAL HISTORY:  ?Past Medical History:  ?Diagnosis Date  ? Cancer ([place]) 02/12/2021  ? breast  ? Family history of breast cancer 02/13/2021  ? Palpitations 05/23/2019  ? PONV (postoperative nausea and vomiting)   ? SOB (shortness of breath)   ? ?Past Surgical History:  ?Procedure Laterality Date  ? BREAST RECONSTRUCTION WITH PLACEMENT OF TISSUE EXPANDER AND ALLODERM Right 03/20/2021  ? Procedure: RIGHT BREAST RECONSTRUCTION WITH PLACEMENT OF TISSUE EXPANDER AND ALLODERM;  Surgeon: Irene Limbo, MD;  Location: [facility];  Service: Plastics;  Laterality: Right;  ? CESAREAN SECTION    ? CHOLECYSTECTOMY N/A 06/13/2012  ? Procedure: LAPAROSCOPIC CHOLECYSTECTOMY WITH INTRAOPERATIVE CHOLANGIOGRAM;  Surgeon: Earnstine Regal, MD;  [facility];  Service: General;  Laterality: N/A;  ? FOOT SURGERY    ? MASTECTOMY W/ SENTINEL NODE  BIOPSY Right 03/20/2021  ? Procedure: RIGHT MASTECTOMY WITH RIGHT AXILLARY SENTINEL LYMPH NODE BIOPSY;  Surgeon: Rolm Bookbinder, MD;  Location: [facility];  Service: General;  Laterality: [place]

## 2021-07-15 ENCOUNTER — Inpatient Hospital Stay: Payer: Managed Care, Other (non HMO) | Attending: Hematology and Oncology | Admitting: Nurse Practitioner

## 2021-07-15 ENCOUNTER — Other Ambulatory Visit: Payer: Self-pay

## 2021-07-15 ENCOUNTER — Encounter: Payer: Self-pay | Admitting: Nurse Practitioner

## 2021-07-15 VITALS — BP 109/73 | HR 72 | Temp 99.3°F | Resp 16 | Ht 67.0 in | Wt 161.6 lb

## 2021-07-15 DIAGNOSIS — C50211 Malignant neoplasm of upper-inner quadrant of right female breast: Secondary | ICD-10-CM | POA: Diagnosis not present

## 2021-07-15 DIAGNOSIS — Z170 Estrogen receptor positive status [ER+]: Secondary | ICD-10-CM | POA: Diagnosis not present

## 2021-07-15 DIAGNOSIS — Z79810 Long term (current) use of selective estrogen receptor modulators (SERMs): Secondary | ICD-10-CM | POA: Insufficient documentation

## 2021-07-22 ENCOUNTER — Other Ambulatory Visit: Payer: Self-pay

## 2021-07-22 ENCOUNTER — Encounter (HOSPITAL_BASED_OUTPATIENT_CLINIC_OR_DEPARTMENT_OTHER): Payer: Self-pay | Admitting: Plastic Surgery

## 2021-07-29 NOTE — Progress Notes (Signed)
? ? ? ? ? ?  Patient Instructions ? ?The night before surgery:  ?No food after midnight. ONLY clear liquids after midnight ? ?The day of surgery (if you do NOT have diabetes):  ?Drink ONE (1) Pre-Surgery Clear Ensure as directed.   ?This drink was given to you during your hospital  ?pre-op appointment visit. ?The pre-op nurse will instruct you on the time to drink the  ?Pre-Surgery Ensure depending on your surgery time. ?Finish the drink at the designated time by the pre-op nurse.  ?Nothing else to drink after completing the  ?Pre-Surgery Clear Ensure. ? ?The day of surgery (if you have diabetes): ?Drink ONE (1) Gatorade 2 (G2) as directed. ?This drink was given to you during your hospital  ?pre-op appointment visit.  ?The pre-op nurse will instruct you on the time to drink the  ? Gatorade 2 (G2) depending on your surgery time. ?Color of the Gatorade may vary. Red is not allowed. ?Nothing else to drink after completing the  ?Gatorade 2 (G2). ? ?       If you have questions, please contact your surgeon?s office.  ? ?Patient given presurgical soap. Education provided and patient verbalized understanding. ?

## 2021-07-31 ENCOUNTER — Other Ambulatory Visit: Payer: Self-pay

## 2021-07-31 ENCOUNTER — Encounter (HOSPITAL_BASED_OUTPATIENT_CLINIC_OR_DEPARTMENT_OTHER): Payer: Self-pay | Admitting: Plastic Surgery

## 2021-07-31 ENCOUNTER — Encounter (HOSPITAL_BASED_OUTPATIENT_CLINIC_OR_DEPARTMENT_OTHER)
Admission: RE | Disposition: A | Discharge status: Home / Self Care | Payer: Self-pay | Source: Home / Self Care | Attending: Plastic Surgery

## 2021-07-31 ENCOUNTER — Ambulatory Visit (HOSPITAL_BASED_OUTPATIENT_CLINIC_OR_DEPARTMENT_OTHER): Payer: Managed Care, Other (non HMO) | Admitting: Anesthesiology

## 2021-07-31 ENCOUNTER — Ambulatory Visit (HOSPITAL_BASED_OUTPATIENT_CLINIC_OR_DEPARTMENT_OTHER)
Admission: RE | Admit: 2021-07-31 | Discharge: 2021-07-31 | Disposition: A | Discharge status: Home / Self Care | Payer: Managed Care, Other (non HMO) | Attending: Plastic Surgery | Admitting: Plastic Surgery

## 2021-07-31 DIAGNOSIS — Z45811 Encounter for adjustment or removal of right breast implant: Secondary | ICD-10-CM

## 2021-07-31 DIAGNOSIS — Z853 Personal history of malignant neoplasm of breast: Secondary | ICD-10-CM

## 2021-07-31 DIAGNOSIS — Z79810 Long term (current) use of selective estrogen receptor modulators (SERMs): Secondary | ICD-10-CM | POA: Insufficient documentation

## 2021-07-31 DIAGNOSIS — Z421 Encounter for breast reconstruction following mastectomy: Secondary | ICD-10-CM | POA: Insufficient documentation

## 2021-07-31 DIAGNOSIS — Z9011 Acquired absence of right breast and nipple: Secondary | ICD-10-CM | POA: Insufficient documentation

## 2021-07-31 HISTORY — PX: LIPOSUCTION WITH LIPOFILLING: SHX6436

## 2021-07-31 HISTORY — PX: BREAST REDUCTION SURGERY: SHX8

## 2021-07-31 HISTORY — PX: REDUCTION MAMMAPLASTY: SUR839

## 2021-07-31 HISTORY — PX: REMOVAL OF TISSUE EXPANDER AND PLACEMENT OF IMPLANT: SHX6457

## 2021-07-31 LAB — POCT PREGNANCY, URINE: Preg Test, Ur: NEGATIVE

## 2021-07-31 SURGERY — REMOVAL, TISSUE EXPANDER, BREAST, WITH IMPLANT INSERTION
Anesthesia: General | Site: Chest | Laterality: Right

## 2021-07-31 MED ORDER — OXYCODONE HCL 5 MG PO TABS
5.0000 mg | ORAL_TABLET | Freq: Once | ORAL | Status: AC
  Administered 2021-07-31: 5 mg via ORAL

## 2021-07-31 MED ORDER — LACTATED RINGERS IV SOLN: INTRAVENOUS | Status: DC

## 2021-07-31 MED ORDER — OXYCODONE HCL 5 MG PO TABS
ORAL_TABLET | ORAL | Status: AC
  Filled 2021-07-31: qty 1

## 2021-07-31 MED ORDER — BUPIVACAINE HCL (PF) 0.5 % IJ SOLN
INTRAMUSCULAR | Status: DC | PRN
  Administered 2021-07-31: 20 mL

## 2021-07-31 MED ORDER — PROPOFOL 10 MG/ML IV BOLUS
INTRAVENOUS | Status: DC | PRN
  Administered 2021-07-31: 150 mg via INTRAVENOUS

## 2021-07-31 MED ORDER — HYDROMORPHONE HCL 1 MG/ML IJ SOLN: 0.2500 mg | INTRAMUSCULAR | Status: DC | PRN

## 2021-07-31 MED ORDER — CEFAZOLIN SODIUM-DEXTROSE 2-4 GM/100ML-% IV SOLN
2.0000 g | INTRAVENOUS | Status: AC
  Administered 2021-07-31: 2 g via INTRAVENOUS

## 2021-07-31 MED ORDER — SODIUM BICARBONATE 4.2 % IV SOLN
INTRAVENOUS | Status: DC | PRN
  Administered 2021-07-31: 250 mL via INTRAMUSCULAR

## 2021-07-31 MED ORDER — GABAPENTIN 300 MG PO CAPS
300.0000 mg | ORAL_CAPSULE | ORAL | Status: AC
  Administered 2021-07-31: 300 mg via ORAL

## 2021-07-31 MED ORDER — LIDOCAINE HCL (CARDIAC) PF 100 MG/5ML IV SOSY
PREFILLED_SYRINGE | INTRAVENOUS | Status: DC | PRN
  Administered 2021-07-31: 60 mg via INTRAVENOUS

## 2021-07-31 MED ORDER — POVIDONE-IODINE 10 % EX OINT
TOPICAL_OINTMENT | CUTANEOUS | Status: DC | PRN
  Administered 2021-07-31: 1 via TOPICAL

## 2021-07-31 MED ORDER — EPINEPHRINE PF 1 MG/ML IJ SOLN
INTRAMUSCULAR | Status: AC
  Filled 2021-07-31: qty 1

## 2021-07-31 MED ORDER — LIDOCAINE HCL (PF) 1 % IJ SOLN
INTRAMUSCULAR | Status: AC
  Filled 2021-07-31: qty 30

## 2021-07-31 MED ORDER — CEFAZOLIN SODIUM-DEXTROSE 2-4 GM/100ML-% IV SOLN
INTRAVENOUS | Status: AC
Start: 2021-07-31 — End: ?
  Filled 2021-07-31: qty 100

## 2021-07-31 MED ORDER — GABAPENTIN 300 MG PO CAPS
ORAL_CAPSULE | ORAL | Status: AC
  Filled 2021-07-31: qty 1

## 2021-07-31 MED ORDER — GENTAMICIN SULFATE 40 MG/ML IJ SOLN: INTRAMUSCULAR | Status: DC | PRN

## 2021-07-31 MED ORDER — FENTANYL CITRATE (PF) 100 MCG/2ML IJ SOLN
INTRAMUSCULAR | Status: AC
  Filled 2021-07-31: qty 2

## 2021-07-31 MED ORDER — BUPIVACAINE HCL (PF) 0.5 % IJ SOLN
INTRAMUSCULAR | Status: AC
  Filled 2021-07-31: qty 30

## 2021-07-31 MED ORDER — DEXAMETHASONE SODIUM PHOSPHATE 4 MG/ML IJ SOLN
INTRAMUSCULAR | Status: DC | PRN
  Administered 2021-07-31: 10 mg via INTRAVENOUS

## 2021-07-31 MED ORDER — ROCURONIUM BROMIDE 10 MG/ML (PF) SYRINGE
PREFILLED_SYRINGE | INTRAVENOUS | Status: AC
  Filled 2021-07-31: qty 10

## 2021-07-31 MED ORDER — LIDOCAINE 2% (20 MG/ML) 5 ML SYRINGE
INTRAMUSCULAR | Status: AC
  Filled 2021-07-31: qty 5

## 2021-07-31 MED ORDER — PHENYLEPHRINE HCL (PRESSORS) 10 MG/ML IV SOLN
INTRAVENOUS | Status: DC | PRN
  Administered 2021-07-31 (×3): 80 ug via INTRAVENOUS

## 2021-07-31 MED ORDER — CHLORHEXIDINE GLUCONATE CLOTH 2 % EX PADS: 6.0000 | MEDICATED_PAD | Freq: Once | CUTANEOUS | Status: DC

## 2021-07-31 MED ORDER — 0.9 % SODIUM CHLORIDE (POUR BTL) OPTIME
TOPICAL | Status: DC | PRN
  Administered 2021-07-31: 1000 mL

## 2021-07-31 MED ORDER — MIDAZOLAM HCL 5 MG/5ML IJ SOLN
INTRAMUSCULAR | Status: DC | PRN
  Administered 2021-07-31: 2 mg via INTRAVENOUS

## 2021-07-31 MED ORDER — SODIUM CHLORIDE 0.9 % IV SOLN
INTRAVENOUS | Status: AC
  Filled 2021-07-31: qty 10

## 2021-07-31 MED ORDER — FENTANYL CITRATE (PF) 100 MCG/2ML IJ SOLN
INTRAMUSCULAR | Status: DC | PRN
  Administered 2021-07-31 (×4): 50 ug via INTRAVENOUS

## 2021-07-31 MED ORDER — SODIUM CHLORIDE (PF) 0.9 % IJ SOLN
INTRAMUSCULAR | Status: DC | PRN
  Administered 2021-07-31: 500 mL

## 2021-07-31 MED ORDER — SCOPOLAMINE 1 MG/3DAYS TD PT72
MEDICATED_PATCH | TRANSDERMAL | Status: AC
  Filled 2021-07-31: qty 1

## 2021-07-31 MED ORDER — ROCURONIUM BROMIDE 100 MG/10ML IV SOLN
INTRAVENOUS | Status: DC | PRN
Start: 2021-07-31 — End: 2021-07-31
  Administered 2021-07-31: 60 mg via INTRAVENOUS
  Administered 2021-07-31: 10 mg via INTRAVENOUS

## 2021-07-31 MED ORDER — ACETAMINOPHEN 500 MG PO TABS
ORAL_TABLET | ORAL | Status: AC
  Filled 2021-07-31: qty 2

## 2021-07-31 MED ORDER — CELECOXIB 200 MG PO CAPS
200.0000 mg | ORAL_CAPSULE | ORAL | Status: AC
  Administered 2021-07-31: 200 mg via ORAL

## 2021-07-31 MED ORDER — ONDANSETRON HCL 4 MG/2ML IJ SOLN
INTRAMUSCULAR | Status: DC | PRN
Start: 2021-07-31 — End: 2021-07-31
  Administered 2021-07-31: 4 mg via INTRAVENOUS

## 2021-07-31 MED ORDER — ACETAMINOPHEN 500 MG PO TABS
1000.0000 mg | ORAL_TABLET | ORAL | Status: AC
  Administered 2021-07-31: 1000 mg via ORAL

## 2021-07-31 MED ORDER — EPHEDRINE SULFATE (PRESSORS) 50 MG/ML IJ SOLN
INTRAMUSCULAR | Status: DC | PRN
  Administered 2021-07-31: 10 mg via INTRAVENOUS

## 2021-07-31 MED ORDER — MIDAZOLAM HCL 2 MG/2ML IJ SOLN
INTRAMUSCULAR | Status: AC
  Filled 2021-07-31: qty 2

## 2021-07-31 MED ORDER — SODIUM BICARBONATE 4.2 % IV SOLN
INTRAVENOUS | Status: AC
  Filled 2021-07-31: qty 10

## 2021-07-31 MED ORDER — SUGAMMADEX SODIUM 200 MG/2ML IV SOLN
INTRAVENOUS | Status: DC | PRN
  Administered 2021-07-31: 200 mg via INTRAVENOUS

## 2021-07-31 MED ORDER — PROPOFOL 500 MG/50ML IV EMUL
INTRAVENOUS | Status: AC
  Filled 2021-07-31: qty 50

## 2021-07-31 MED ORDER — CELECOXIB 200 MG PO CAPS
ORAL_CAPSULE | ORAL | Status: AC
  Filled 2021-07-31: qty 1

## 2021-07-31 SURGICAL SUPPLY — 90 items
ADH SKN CLS APL DERMABOND .7 (GAUZE/BANDAGES/DRESSINGS) ×4
APL PRP STRL LF DISP 70% ISPRP (MISCELLANEOUS) ×4
BAG DECANTER FOR FLEXI CONT (MISCELLANEOUS) ×3 IMPLANT
BINDER ABDOMINAL 10 UNV 27-48 (MISCELLANEOUS) IMPLANT
BINDER ABDOMINAL 12 SM 30-45 (SOFTGOODS) ×1 IMPLANT
BINDER BREAST 3XL (GAUZE/BANDAGES/DRESSINGS) IMPLANT
BINDER BREAST LRG (GAUZE/BANDAGES/DRESSINGS) ×1 IMPLANT
BINDER BREAST MEDIUM (GAUZE/BANDAGES/DRESSINGS) IMPLANT
BINDER BREAST XLRG (GAUZE/BANDAGES/DRESSINGS) IMPLANT
BINDER BREAST XXLRG (GAUZE/BANDAGES/DRESSINGS) IMPLANT
BLADE SURG 10 STRL SS (BLADE) ×12 IMPLANT
BLADE SURG 11 STRL SS (BLADE) ×3 IMPLANT
BLADE SURG 15 STRL LF DISP TIS (BLADE) IMPLANT
BLADE SURG 15 STRL SS (BLADE) ×3
BNDG GAUZE ELAST 4 BULKY (GAUZE/BANDAGES/DRESSINGS) ×6 IMPLANT
CANISTER LIPO FAT HARVEST (MISCELLANEOUS) ×3 IMPLANT
CANISTER SUCT 1200ML W/VALVE (MISCELLANEOUS) ×3 IMPLANT
CHLORAPREP W/TINT 26 (MISCELLANEOUS) ×6 IMPLANT
COVER BACK TABLE 60X90IN (DRAPES) ×3 IMPLANT
COVER MAYO STAND STRL (DRAPES) ×6 IMPLANT
DERMABOND ADVANCED (GAUZE/BANDAGES/DRESSINGS) ×2
DERMABOND ADVANCED .7 DNX12 (GAUZE/BANDAGES/DRESSINGS) ×4 IMPLANT
DRAIN CHANNEL 15F RND FF W/TCR (WOUND CARE) ×1 IMPLANT
DRAPE TOP ARMCOVERS (MISCELLANEOUS) ×3 IMPLANT
DRAPE U-SHAPE 76X120 STRL (DRAPES) ×3 IMPLANT
DRAPE UTILITY XL STRL (DRAPES) ×4 IMPLANT
DRSG PAD ABDOMINAL 8X10 ST (GAUZE/BANDAGES/DRESSINGS) ×8 IMPLANT
ELECT BLADE 4.0 EZ CLEAN MEGAD (MISCELLANEOUS)
ELECT COATED BLADE 2.86 ST (ELECTRODE) ×3 IMPLANT
ELECT REM PT RETURN 9FT ADLT (ELECTROSURGICAL) ×3
ELECTRODE BLDE 4.0 EZ CLN MEGD (MISCELLANEOUS) ×2 IMPLANT
ELECTRODE REM PT RTRN 9FT ADLT (ELECTROSURGICAL) ×2 IMPLANT
EVACUATOR SILICONE 100CC (DRAIN) ×1 IMPLANT
GLOVE BIO SURGEON STRL SZ 6 (GLOVE) ×6 IMPLANT
GLOVE BIOGEL PI IND STRL 7.0 (GLOVE) IMPLANT
GLOVE BIOGEL PI INDICATOR 7.0 (GLOVE) ×2
GOWN STRL REUS W/ TWL LRG LVL3 (GOWN DISPOSABLE) ×4 IMPLANT
GOWN STRL REUS W/TWL LRG LVL3 (GOWN DISPOSABLE) ×6
IMPL BREAST HP 465-505CC (Breast) IMPLANT
IMPLANT BREAST HP 465-505CC (Breast) ×3 IMPLANT
IV NS 1000ML (IV SOLUTION)
IV NS 1000ML BAXH (IV SOLUTION) IMPLANT
IV NS 500ML (IV SOLUTION) ×3
IV NS 500ML BAXH (IV SOLUTION) IMPLANT
KIT FILL ASEPTIC TRANSFER (MISCELLANEOUS) ×1 IMPLANT
LINER CANISTER 1000CC FLEX (MISCELLANEOUS) ×3 IMPLANT
MARKER SKIN DUAL TIP RULER LAB (MISCELLANEOUS) IMPLANT
NDL FILTER BLUNT 18X1 1/2 (NEEDLE) IMPLANT
NDL HYPO 25X1 1.5 SAFETY (NEEDLE) IMPLANT
NDL SAFETY ECLIPSE 18X1.5 (NEEDLE) ×2 IMPLANT
NEEDLE FILTER BLUNT 18X 1/2SAF (NEEDLE) ×1
NEEDLE FILTER BLUNT 18X1 1/2 (NEEDLE) ×2 IMPLANT
NEEDLE HYPO 18GX1.5 SHARP (NEEDLE) ×3
NEEDLE HYPO 25X1 1.5 SAFETY (NEEDLE) ×3 IMPLANT
NS IRRIG 1000ML POUR BTL (IV SOLUTION) ×3 IMPLANT
PACK BASIN DAY SURGERY FS (CUSTOM PROCEDURE TRAY) ×3 IMPLANT
PAD ALCOHOL SWAB (MISCELLANEOUS) ×3 IMPLANT
PENCIL SMOKE EVACUATOR (MISCELLANEOUS) ×3 IMPLANT
PIN SAFETY STERILE (MISCELLANEOUS) ×3 IMPLANT
SHEET MEDIUM DRAPE 40X70 STRL (DRAPES) ×3 IMPLANT
SIZER BREAST SGL USE HP 465CC (SIZER) ×3
SIZER BRST SGL USE HP 465CC (SIZER) IMPLANT
SLEEVE SCD COMPRESS KNEE MED (STOCKING) ×3 IMPLANT
SPIKE FLUID TRANSFER (MISCELLANEOUS) IMPLANT
SPONGE T-LAP 18X18 ~~LOC~~+RFID (SPONGE) ×10 IMPLANT
STAPLER VISISTAT 35W (STAPLE) ×3 IMPLANT
SUT ETHILON 2 0 FS 18 (SUTURE) ×3 IMPLANT
SUT MNCRL AB 4-0 PS2 18 (SUTURE) ×11 IMPLANT
SUT PDS AB 2-0 CT2 27 (SUTURE) ×1 IMPLANT
SUT SILK 2 0 SH (SUTURE) IMPLANT
SUT VIC AB 3-0 PS1 18 (SUTURE) ×9
SUT VIC AB 3-0 PS1 18XBRD (SUTURE) ×8 IMPLANT
SUT VIC AB 3-0 SH 27 (SUTURE) ×3
SUT VIC AB 3-0 SH 27X BRD (SUTURE) ×2 IMPLANT
SUT VICRYL 4-0 PS2 18IN ABS (SUTURE) ×6 IMPLANT
SYR 10ML LL (SYRINGE) ×9 IMPLANT
SYR 20ML LL LF (SYRINGE) IMPLANT
SYR 50ML LL SCALE MARK (SYRINGE) ×3 IMPLANT
SYR BULB IRRIG 60ML STRL (SYRINGE) ×6 IMPLANT
SYR CONTROL 10ML LL (SYRINGE) IMPLANT
SYR TB 1ML LL NO SAFETY (SYRINGE) ×3 IMPLANT
SYR TOOMEY IRRIG 70ML (MISCELLANEOUS)
SYRINGE TOOMEY IRRIG 70ML (MISCELLANEOUS) IMPLANT
TAPE MEASURE VINYL STERILE (MISCELLANEOUS) IMPLANT
TOWEL GREEN STERILE FF (TOWEL DISPOSABLE) ×6 IMPLANT
TUBE CONNECTING 20X1/4 (TUBING) ×5 IMPLANT
TUBING INFILTRATION IT-10001 (TUBING) ×3 IMPLANT
TUBING SET GRADUATE ASPIR 12FT (MISCELLANEOUS) ×3 IMPLANT
UNDERPAD 30X36 HEAVY ABSORB (UNDERPADS AND DIAPERS) ×6 IMPLANT
YANKAUER SUCT BULB TIP NO VENT (SUCTIONS) ×3 IMPLANT

## 2021-07-31 NOTE — Anesthesia Postprocedure Evaluation (Signed)
Anesthesia Post Note ? ?Patient: Janet Snyder ? ?Procedure(s) Performed: REMOVAL OF TISSUE EXPANDER AND PLACEMENT OF IMPLANT (Right: Chest) ?LIPOFILLING FROM ABDOMEN TO RIGHT CHEST (Right: Chest) ?MAMMARY REDUCTION  (BREAST) (Left: Breast) ? ?  ? ?Patient location during evaluation: PACU ?Anesthesia Type: General ?Level of consciousness: awake ?Pain management: pain level controlled ?Vital Signs Assessment: post-procedure vital signs reviewed and stable ?Respiratory status: spontaneous breathing ?Cardiovascular status: stable ?Postop Assessment: no apparent nausea or vomiting ?Anesthetic complications: no ? ? ?No notable events documented. ? ?Last Vitals:  ?Vitals:  ? 07/31/21 1145 07/31/21 1202  ?BP: 116/65 123/73  ?Pulse: 63 77  ?Resp: 12 18  ?Temp:  (!) 36.2 ?C  ?SpO2: 100% 97%  ?  ?Last Pain:  ?Vitals:  ? 07/31/21 1202  ?TempSrc: Oral  ?PainSc: 4   ? ? ?  ?  ?  ?  ?  ?  ? ?Alp [place] ? ? ? ? ?

## 2021-07-31 NOTE — Anesthesia Preprocedure Evaluation (Signed)
Anesthesia Evaluation  ?Patient identified by MRN, date of birth, ID band ?Patient awake ? ? ? ?Reviewed: ?Allergy & Precautions, NPO status , Patient's Chart, lab work & pertinent test results ? ?History of Anesthesia Complications ?(+) PONV and history of anesthetic complications ? ?Airway ?Mallampati: II ? ?TM Distance: >3 FB ? ? ? ? Dental ?  ?Pulmonary ?neg pulmonary ROS,  ?  ?breath sounds clear to auscultation ? ? ? ? ? ? Cardiovascular ?negative cardio ROS ? ? ?Rhythm:Regular Rate:Normal ? ? ?  ?Neuro/Psych ?negative neurological ROS ?   ? GI/Hepatic ?negative GI ROS, Neg liver ROS,   ?Endo/Other  ?negative endocrine ROS ? Renal/GU ?negative Renal ROS  ? ?  ?Musculoskeletal ? ? Abdominal ?  ?Peds ? Hematology ?  ?Anesthesia Other Findings ? ? Reproductive/Obstetrics ? ?  ? ? ? ? ? ? ? ? ? ? ? ? ? ?  ?  ? ? ? ? ? ? ? ? ?Anesthesia Physical ?Anesthesia Plan ? ?ASA: 3 ? ?Anesthesia Plan: General  ? ?Post-op Pain Management:   ? ?Induction: Intravenous ? ?PONV Risk Score and Plan: 4 or greater and Ondansetron, Dexamethasone and Midazolam ? ?Airway Management Planned: Oral ETT ? ?Additional Equipment:  ? ?Intra-op Plan:  ? ?Post-operative Plan: Possible Post-op intubation/ventilation ? ?Informed Consent: I have reviewed the patients History and Physical, chart, labs and discussed the procedure including the risks, benefits and alternatives for the proposed anesthesia with the patient or authorized representative who has indicated his/her understanding and acceptance.  ? ? ? ?Dental advisory given ? ?Plan Discussed with: CRNA and Anesthesiologist ? ?Anesthesia Plan Comments:   ? ? ? ? ? ? ?Anesthesia Quick Evaluation ? ?

## 2021-07-31 NOTE — Op Note (Signed)
Operative Note  ? ?DATE OF OPERATION: 5.5.23 ? ?LOCATION: [facility]-outpatient ? ?SURGICAL DIVISION: Plastic Surgery ? ?PREOPERATIVE DIAGNOSES:  1. History breast cancer 2. Acquired absence breast right  ? ?POSTOPERATIVE DIAGNOSES:  same ? ?PROCEDURE:  1. Removal right chest tissue expander and placement saline implant 2. Left breast reduction 3. Lipofilling to right chest 60 ml ? ?SURGEON: Irene Limbo MD MBA ? ?ASSISTANT: none ? ?ANESTHESIA:  General.  ? ?EBL: 50 ml ? ?COMPLICATIONS: None immediate.  ? ?INDICATIONS FOR PROCEDURE:  ?The patient, Janet Snyder, is a 46 y.o. female born on 08-30-1975, is here for staged breast reconstruction following right skin reduction mastectomy and prepectoral expander reconstruction. ?  ?FINDINGS: Complete incorporation ADM noted. Left breast reduction 61 g. Natrelle Smooth Round High Profile Saline implant placed in right chest, 465 ml fill volume 505 ml saline. REF [id number] ? ?DESCRIPTION OF PROCEDURE:  ?The patient was marked standing in the preoperative area to mark sternal notch, chest midline, anterior axillary lines, inframammary folds. Bilateral flanks marked for liposuction. Over left breast, the location of new nipple areolar complex was marked at level of on inframammary fold on anterior surface breast by palpation. With aid of Wise pattern marker, location of new nipple areolar complex and vertical limbs (6 cm) were marked by displacement of breasts along meridian. The patient was taken to the operating room. SCDs were placed and IV antibiotics were given. The patient's operative site was prepped and draped in a sterile fashion. A time out was performed and all information was confirmed to be correct.   ?  ?Over left breast, superior pedicle marked and nipple areolar complex incised with 42 mm diameter. Pedicle deepithlialized and developed to chest wall. Inferiorly based dermoglandular pedicle preserved for use as auto augmentation. This  was advanced superiorly and secured to chest wall with interrupted 2-0 PDS suture. Medial and lateral flaps developed. Lateral breast tissue excised. Breast irrigated. Hemostasis obtained. Local anesthetic infiltrated. 15 Fr JP placed percutaneously and secured with 2-0 nylon. Breast tailor tacked closed.  ? ?I then directed attention to right breast. Incision made in inframammary fold scar and carried through superficial fascia and capsule. Capsulotomies performed superiorly. Sizer placed. Patient brought to upright sitting position. A high profile 465 ml implant selected for right chest. Patient returned to supine position.  ? ?Stab incision made over bilateral abdomen. Tumescent fluid infiltrated over bilateral flanks, total 350 ml tumescent infiltrated. Power assisted liposuction performed to endpoint symmetric contour and soft tissue thickness, total lipoaspirate 200 ml. The fat was then washed and prepared by gravity for infiltration. Harvested fat was then infiltrated in subcutaneous plane throughout total envelope right mastectomy flap.  ?  ?Right chest cavity irrigated with saline solution containing Betadine, Ancef, gentamicin solution. Hemostasis ensured.The implant was prepared and placed in right chest. Implant filled to final fill volume 505 ml. Fill tubing removed. Fill tubing tab closure and implant orientation ensured. Closure completed with 3-0 vicryl to close superficial fascia and capsule over implant. 4-0 vicryl used to close dermis followed by 4-0 monocryl subcuticular.  ? ?Over left breast, closure completed with 3-0 vicryl to approximate dermis along inframammary fold and vertical limb. NAC inset with 4-0 vicryl in dermis. Skin closure completed with 4-0 monocryl subcuticular throughout. Abdomen incisions approximated with simple 4-0 monocryl stitch. Dermabond applied to chest incisions. Dry dressing applied, followed by breast binder and abdominal binder. ? ?The patient was allowed to wake  from anesthesia, extubated and taken to the recovery  room in satisfactory condition.  ? ?SPECIMENS: left breast reduction ? ?DRAINS: 15 Fr JP in left breast ? ?Irene Limbo, MD MBA ?Plastic & Reconstructive Surgery ? ?Office/ physician access line after hours [phone]  ? ?

## 2021-07-31 NOTE — H&P (Signed)
Subjective:  ? ?Patient ID: Janet Snyder is a 46 y.o. female. ? ?HPI ? ?4.5 months post op mastectomy with immediate expander reconstruction. Presents for implant exchange. ? ?Presented following screening MMG with right breast distortion. Diagnostic MMG/US showed mass at 1 o clock 2 cmfn, 1.3 x 2.4 x 1.2 cm. Calcifications present extending anterior and posterior to the distortion, spaning 8 cm. No axillary adenopathy. Biopsy demonstrated IDC with DCIS, ER/PR+, Her2-. ? ?MRI showed piopsy-proven malignancy over the right UIQ measuring 1.6 x 1.8 x 1.2 cm. Focal NME over the right LOQ measuring 8 x 10 x 7 mm noted. ? ?US guided biopsies of calcs completed. Specimens labeled right breast, right UIQ, posterior depth,and right breast UIQ, anterior both with DCIS ER/PR+. ? ?Given span of disease mastectomy recommended. Final pathology 1.9 cm IDC with DCIS, 0/4 SLN. Oncotype 13/4%. On tamoxifen. ? ?Genetics negative.  ? ?Prior 45 DD. Right mastectomy 731 g ? 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 Lives with spouse and three kids. ? ?Review of Systems ? ? ?Objective:  ?Physical Exam  ?CV: normal heart sounds regular rate ?Pulm: clear to auscultation ?Chest: right chest expanded, rippling present  ?Sn to nipple L 27 cm ?BW 17 cm ?Nipple to IMF L 12cm ? ?Abd sufficient soft tissue donor site for lipofilling from abdomen and flanks ? ? ?Assessment:  ? ?Right breast ca UIQ ER+ ?S/p right SRM, SLN, prepectoral TE/ADM (Alloderm) reconstruction ? ?Plan:  ? ?Hold tamoxifen 7-10 d prior to surgery. ? ?Plan removal right TE and placement saline implant, left breast reduction, lipofilling to right chest. ? ?Discussed saline vs silicone, smooth vs textured, shaped vs round. As in prepectoral position recommend capacity filled or HCG implants to minimize visible rippling. Reviewed risks BIA ALCL with textured devices and new reports of SCC related to  implants in general. Patient has elected for saline, plan smooth round. Provided Natrelle Saline implant infomation. Completed physican patient checklist. ? ?Over left breast, plan left breast reduction. Reviewed will always have asymmetry with right implant based reconstruction. Reviewed reduction with anchor type scars, drain, Diminished sensation nipple and breast skin, risk of nipple loss, wound healing problems, asymmetry, incidental carcinoma, changes with wt gain/loss, aging, unacceptable cosmetic appearance reviewed. ? ?Reviewed purpose of grafting to aid with contour minimize rippling. Reviewed variable take of graft may need to repeat fat necrosis that presents as palpable masses. Reviewed post op pain and need for compression. Plan bilateral flank donor site. Recommend she purchase Spanx type garment for post op use. ? ?Natrelle 133S FV-12-T 400 ml tissue expander placed,  ?fill volume 300 ml saline ? ?

## 2021-07-31 NOTE — Discharge Instructions (Addendum)

## 2021-07-31 NOTE — Anesthesia Procedure Notes (Signed)
Procedure Name: Intubation ?Date/Time: 07/31/2021 7:52 AM ?Performed by: Maryella Shivers, CRNA ?Pre-anesthesia Checklist: Patient identified, Emergency Drugs available, Suction available and Patient being monitored ?Patient Re-evaluated:Patient Re-evaluated prior to induction ?Oxygen Delivery Method: Circle system utilized ?Preoxygenation: Pre-oxygenation with 100% oxygen ?Induction Type: IV induction ?Ventilation: Mask ventilation without difficulty ?Laryngoscope Size: Mac and 3 ?Grade View: Grade I ?Tube type: Oral ?Tube size: 7.0 mm ?Number of attempts: 1 ?Airway Equipment and Method: Stylet and Oral airway ?Placement Confirmation: ETT inserted through vocal cords under direct vision, positive ETCO2 and breath sounds checked- equal and bilateral ?Secured at: 21 cm ?Tube secured with: Tape ?Dental Injury: Teeth and Oropharynx as per pre-operative assessment  ? ? ? ? ?

## 2021-07-31 NOTE — Transfer of Care (Signed)
Immediate Anesthesia Transfer of Care Note ? ?Patient: Janet Snyder ? ?Procedure(s) Performed: REMOVAL OF TISSUE EXPANDER AND PLACEMENT OF IMPLANT (Right: Chest) ?LIPOFILLING FROM ABDOMEN TO RIGHT CHEST (Right: Chest) ?MAMMARY REDUCTION  (BREAST) (Left: Breast) ? ?Patient Location: PACU ? ?Anesthesia Type:General ? ?Level of Consciousness: awake, alert  and oriented ? ?Airway & Oxygen Therapy: Patient Spontanous Breathing and Patient connected to face mask oxygen ? ?Post-op Assessment: Report given to RN and Post -op Vital signs reviewed and stable ? ?Post vital signs: Reviewed and stable ? ?Last Vitals:  ?Vitals Value Taken Time  ?BP 113/77 07/31/21 1100  ?Temp    ?Pulse 65 07/31/21 1101  ?Resp 12 07/31/21 1101  ?SpO2 100 % 07/31/21 1101  ?Vitals shown include unvalidated device data. ? ?Last Pain:  ?Vitals:  ? 07/31/21 0628  ?TempSrc: Oral  ?PainSc: 0-No pain  ?   ? ?Patients Stated Pain Goal: 3 (07/31/21 9021) ? ?Complications: No notable events documented. ?

## 2021-08-03 ENCOUNTER — Encounter (HOSPITAL_BASED_OUTPATIENT_CLINIC_OR_DEPARTMENT_OTHER): Payer: Self-pay | Admitting: Plastic Surgery

## 2021-08-03 LAB — SURGICAL PATHOLOGY

## 2021-08-31 ENCOUNTER — Other Ambulatory Visit: Payer: Self-pay | Admitting: Hematology

## 2021-10-14 ENCOUNTER — Other Ambulatory Visit: Payer: Self-pay

## 2021-10-14 DIAGNOSIS — C50211 Malignant neoplasm of upper-inner quadrant of right female breast: Secondary | ICD-10-CM

## 2021-10-15 ENCOUNTER — Inpatient Hospital Stay: Payer: Managed Care, Other (non HMO)

## 2021-10-15 ENCOUNTER — Encounter: Payer: Self-pay | Admitting: Hematology

## 2021-10-15 ENCOUNTER — Other Ambulatory Visit: Payer: Self-pay

## 2021-10-15 ENCOUNTER — Inpatient Hospital Stay: Payer: Managed Care, Other (non HMO) | Attending: Hematology and Oncology | Admitting: Hematology

## 2021-10-15 VITALS — BP 106/72 | HR 71 | Resp 17 | Wt 156.1 lb

## 2021-10-15 DIAGNOSIS — R232 Flushing: Secondary | ICD-10-CM | POA: Insufficient documentation

## 2021-10-15 DIAGNOSIS — Z170 Estrogen receptor positive status [ER+]: Secondary | ICD-10-CM | POA: Insufficient documentation

## 2021-10-15 DIAGNOSIS — Z9049 Acquired absence of other specified parts of digestive tract: Secondary | ICD-10-CM | POA: Diagnosis not present

## 2021-10-15 DIAGNOSIS — N6489 Other specified disorders of breast: Secondary | ICD-10-CM | POA: Diagnosis not present

## 2021-10-15 DIAGNOSIS — C50211 Malignant neoplasm of upper-inner quadrant of right female breast: Secondary | ICD-10-CM | POA: Insufficient documentation

## 2021-10-15 DIAGNOSIS — Z79899 Other long term (current) drug therapy: Secondary | ICD-10-CM | POA: Insufficient documentation

## 2021-10-15 DIAGNOSIS — Z882 Allergy status to sulfonamides status: Secondary | ICD-10-CM | POA: Insufficient documentation

## 2021-10-15 DIAGNOSIS — D696 Thrombocytopenia, unspecified: Secondary | ICD-10-CM | POA: Diagnosis not present

## 2021-10-15 DIAGNOSIS — N6012 Diffuse cystic mastopathy of left breast: Secondary | ICD-10-CM | POA: Insufficient documentation

## 2021-10-15 LAB — CBC WITH DIFFERENTIAL (CANCER CENTER ONLY)
Abs Immature Granulocytes: 0.01 10*3/uL (ref 0.00–0.07)
Basophils Absolute: 0 10*3/uL (ref 0.0–0.1)
Basophils Relative: 0 %
Eosinophils Absolute: 0 10*3/uL (ref 0.0–0.5)
Eosinophils Relative: 1 %
HCT: 35.4 % — ABNORMAL LOW (ref 36.0–46.0)
Hemoglobin: 12.1 g/dL (ref 12.0–15.0)
Immature Granulocytes: 0 %
Lymphocytes Relative: 36 %
Lymphs Abs: 2.1 10*3/uL (ref 0.7–4.0)
MCH: 30.4 pg (ref 26.0–34.0)
MCHC: 34.2 g/dL (ref 30.0–36.0)
MCV: 88.9 fL (ref 80.0–100.0)
Monocytes Absolute: 0.4 10*3/uL (ref 0.1–1.0)
Monocytes Relative: 6 %
Neutro Abs: 3.3 10*3/uL (ref 1.7–7.7)
Neutrophils Relative %: 57 %
Platelet Count: 135 10*3/uL — ABNORMAL LOW (ref 150–400)
RBC: 3.98 MIL/uL (ref 3.87–5.11)
RDW: 11.7 % (ref 11.5–15.5)
WBC Count: 5.8 10*3/uL (ref 4.0–10.5)
nRBC: 0 % (ref 0.0–0.2)

## 2021-10-15 LAB — CMP (CANCER CENTER ONLY)
ALT: 9 U/L (ref 0–44)
AST: 16 U/L (ref 15–41)
Albumin: 4.2 g/dL (ref 3.5–5.0)
Alkaline Phosphatase: 33 U/L — ABNORMAL LOW (ref 38–126)
Anion gap: 8 (ref 5–15)
BUN: 10 mg/dL (ref 6–20)
CO2: 24 mmol/L (ref 22–32)
Calcium: 9.5 mg/dL (ref 8.9–10.3)
Chloride: 102 mmol/L (ref 98–111)
Creatinine: 0.91 mg/dL (ref 0.44–1.00)
GFR, Estimated: >60 mL/min (ref >60)
Glucose, Bld: 77 mg/dL (ref 70–99)
Potassium: 4.1 mmol/L (ref 3.5–5.1)
Sodium: 134 mmol/L — ABNORMAL LOW (ref 135–145)
Total Bilirubin: 0.7 mg/dL (ref 0.3–1.2)
Total Protein: 7.2 g/dL (ref 6.5–8.1)

## 2021-10-15 NOTE — Progress Notes (Signed)
[facility]   [phone] [phone]   Clinic Follow up Note   Patient Care Team: Pcp, No as PCP - General Mauro Kaufmann, RN as Oncology Nurse Navigator Rockwell Germany, RN as Oncology Nurse Navigator Rolm Bookbinder, MD as Consulting Physician (General Surgery) Truitt Merle, MD as Consulting Physician (Hematology) Gery Pray, MD as Consulting Physician (Radiation Oncology) Alla Feeling, NP as Nurse Practitioner (Nurse Practitioner)  Date of Service:  10/15/2021  CHIEF COMPLAINT: f/u of right breast cancer  CURRENT THERAPY:  Tamoxifen, starting 04/17/21  ASSESSMENT & PLAN:  Janet Snyder is a 46 y.o. pre-menopausal female with   1. Malignant neoplasm of upper-inner quadrant of right breast, Stage IA, p(T1c, N0), ER+/PR+/HER2-, Grade 2  -found on screening mammogram. S/p right mastectomy on 02/18/21 with Dr. Donne Hazel. 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 -Oncotype RS of 13, low risk. -she started tamoxifen on 04/17/21. She is tolerating well with manageable hot flashes. -left mammogram scheduled for 01/22/22. -she is clinically doing very well. Labs reviewed, overall stable. Physical exam was unremarkable. There is no clinical concern for recurrence. -we will see her back in 8 months to space out visits with her other doctors.   2. Thrombocytopenia -incidentally noted, mild  -slow decline/fluctuation in platelet count since 2013, stable at 135k today (10/15/21). We will monitor. -possible ITP      PLAN:  -continue tamoxifen, she is doing well  -lab and f/u with NP Lacie in 8 months, then every 6 months    No problem-specific Assessment & Plan notes found for this encounter.   SUMMARY OF ONCOLOGIC HISTORY: Oncology History Overview Note  Cancer Staging Malignant neoplasm of upper-inner quadrant of right breast in female, estrogen receptor positive ([place]) Staging form: Breast, AJCC 8th Edition - Clinical stage from  02/03/2021: Stage IA (cT1c, cN0, cM0, G2, ER+, PR+, HER2-) - Signed by Truitt Merle, MD on 02/10/2021    Malignant neoplasm of upper-inner quadrant of right breast in female, estrogen receptor positive ([place])  01/29/2021 Mammogram   EXAM: DIGITAL DIAGNOSTIC UNILATERAL RIGHT MAMMOGRAM WITH TOMOSYNTHESIS AND CAD; ULTRASOUND RIGHT BREAST LIMITED  IMPRESSION: Highly suspicious right breast mass measuring 13 x 14 x 12 mm at 1 o'clock with associated distortion. There are calcifications in the mass. Calcifications are seen anterior and posterior to the mass as well.   02/03/2021 Cancer Staging   Staging form: Breast, AJCC 8th Edition - Clinical stage from 02/03/2021: Stage IA (cT1c, cN0, cM0, G2, ER+, PR+, HER2-) - Signed by Truitt Merle, MD on 02/10/2021 Stage prefix: Initial diagnosis Histologic grading system: 3 grade system   02/03/2021 Pathology Results   Diagnosis Breast, right, needle core biopsy, 1 o'clock mass, ribbon clip - INVASIVE MAMMARY CARCINOMA - MAMMARY CARCINOMA IN SITU - SEE COMMENT Microscopic Comment The biopsy material shows an infiltrative proliferation of cells arranged linearly and in small clusters. Based on the biopsy, the carcinoma appears Nottingham grade 2 of 3 and measures 1.1 cm in greatest linear extent.  E-cadherin is POSITIVE supporting a ductal origin.  PROGNOSTIC INDICATORS Results: The tumor cells are EQUIVOCAL for Her2 (2+). Her2 by FISH will be performed and results reported separately. Estrogen Receptor: 100%, POSITIVE, STRONG STAINING INTENSITY Progesterone Receptor: 100%, POSITIVE, STRONG STAINING INTENSITY Proliferation Marker Ki67: 5%  FLUORESCENCE IN-SITU HYBRIDIZATION Results: GROUP 5: HER2 **NEGATIVE**   02/06/2021 Initial Diagnosis   Malignant neoplasm of upper-inner quadrant of right breast in female, estrogen receptor positive ([place])   02/13/2021 Imaging  EXAM: BILATERAL BREAST MRI WITH AND WITHOUT CONTRAST  IMPRESSION: 1. Biopsy-proven  malignancy over the right upper inner quadrant measuring 1.6 x 1.8 x 1.2 cm.   2. Focal non mass enhancement over the middle third of the right outer lower quadrant measuring 8 x 10 x 7 mm.   3.  No suspicious findings within the left breast.   02/21/2021 Genetic Testing   Negative hereditary cancer genetic testing: no pathogenic variants detected in [facility] CustomNext-Cancer +RNAinsight Panel.  The report date is February 21, 2021.   The CustomNext-Cancer+RNAinsight panel offered by [facility] includes sequencing and rearrangement analysis for the following 47 genes:  APC, ATM, AXIN2, BARD1, BMPR1A, BRCA1, BRCA2, BRIP1, CDH1, CDK4, CDKN2A, CHEK2, DICER1, EPCAM, GREM1, HOXB13, MEN1, MLH1, MSH2, MSH3, MSH6, MUTYH, NBN, NF1, NF2, NTHL1, PALB2, PMS2, POLD1, POLE, PTEN, RAD51C, RAD51D, RECQL, RET, SDHA, SDHAF2, SDHB, SDHC, SDHD, SMAD4, SMARCA4, STK11, TP53, TSC1, TSC2, and VHL.  RNA data is routinely analyzed for use in variant interpretation for all genes.    02/23/2021 Pathology Results   Diagnosis Breast, right, needle core biopsy, lateral, cylinder clip - FOCAL PSEUDOANGIOMATOUS STROMAL HYPERPLASIA - NO MALIGNANCY IDENTIFIED   03/20/2021 Cancer Staging   Staging form: Breast, AJCC 8th Edition - Pathologic stage from 03/20/2021: Stage IA (pT1c, pN0, cM0, G2, ER+, PR+, HER2-) - Signed by Truitt Merle, MD on 04/17/2021 Stage prefix: Initial diagnosis Multigene prognostic tests performed: Oncotype DX DCIS Histologic grading system: 3 grade system Residual tumor (R): R0 - None    Pathology Results   Diagnosis 1. Breast, right, needle core biopsy, UIQ, posterior depth, x clip - DUCTAL CARCINOMA IN SITU WITH CALCIFICATIONS - SEE COMMENT 2. Breast, right, needle core biopsy, UIQ, anterior, coil clip - DUCTAL CARCINOMA IN SITU - FIBROADENOMATOID AND FIBROCYSTIC CHANGES WITH MICROCALCIFICATIONS - SEE COMMENT  Microscopic Comment 1. Based on the biopsy, the ductal carcinoma in situ has a  solid pattern, intermediate nuclear grade and measures 0.3 cm in greatest linear extent  1. PROGNOSTIC INDICATORS Results: Estrogen Receptor: 100%, POSITIVE, STRONG STAINING INTENSITY Progesterone Receptor: 100%, POSITIVE, STRONG STAINING INTENSITY   03/20/2021 Definitive Surgery   FINAL MICROSCOPIC DIAGNOSIS:   A. BREAST, RIGHT, MASTECTOMY:  - Invasive ductal carcinoma, grade 2, 1.9 cm in maximal extent.  - Invasive tumor is 2.9 cm from deep margin.  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  - Biopsy site changes.   B. LYMPH NODE, SENTINEL, RIGHT AXILLARY, EXCISION:  - 1 lymph node, negative for carcinoma (0/1).   C. LYMPH NODE, SENTINEL, RIGHT AXILLARY, EXCISION:  - 1 lymph node, negative for carcinoma (0/1).   D. LYMPH NODE, SENTINEL, RIGHT AXILLARY, EXCISION:  - 1 lymph node, negative for carcinoma (0/1).   E. LYMPH NODE, SENTINEL, RIGHT AXILLARY, EXCISION:  - 1 lymph node, negative for carcinoma (0/1).    03/2021 -  Anti-estrogen oral therapy   Began tamoxifen 20 mg p.o. once daily, plan for 10 years   07/15/2021 Survivorship   SCP delivered by Cira Rue, NP      INTERVAL HISTORY:  Janet Snyder is here for a follow up of breast cancer. She was last seen by me on 04/17/21 with survivorship in the interim. She presents to the clinic alone. She reports she is doing well overall. She reports her only side effects from tamoxifen is hot flashes. She previously declined medication for these.   All other systems were reviewed with the patient and are negative.  MEDICAL HISTORY:  Past  Medical History:  Diagnosis Date   Cancer ([place]) 02/12/2021   breast   Family history of breast cancer 02/13/2021   Palpitations 05/23/2019   PONV (postoperative nausea and vomiting)    SOB (shortness of breath)     SURGICAL HISTORY: Past Surgical History:  Procedure Laterality Date   BREAST RECONSTRUCTION WITH PLACEMENT OF TISSUE EXPANDER AND ALLODERM Right 03/20/2021    Procedure: RIGHT BREAST RECONSTRUCTION WITH PLACEMENT OF TISSUE EXPANDER AND ALLODERM;  Surgeon: Irene Limbo, MD;  Location: [facility];  Service: Plastics;  Laterality: Right;   BREAST REDUCTION SURGERY Left 07/31/2021   Procedure: MAMMARY REDUCTION  (BREAST);  Surgeon: Irene Limbo, MD;  Location: [facility];  Service: Plastics;  Laterality: Left;   CESAREAN SECTION     CHOLECYSTECTOMY N/A 06/13/2012   Procedure: LAPAROSCOPIC CHOLECYSTECTOMY WITH INTRAOPERATIVE CHOLANGIOGRAM;  Surgeon: Earnstine Regal, MD;  Location: [facility];  Service: General;  Laterality: N/A;   FOOT SURGERY     LIPOSUCTION WITH LIPOFILLING Right 07/31/2021   Procedure: LIPOFILLING FROM ABDOMEN TO RIGHT CHEST;  Surgeon: Irene Limbo, MD;  Location: [facility];  Service: Plastics;  Laterality: Right;   MASTECTOMY W/ SENTINEL NODE BIOPSY Right 03/20/2021   Procedure: RIGHT MASTECTOMY WITH RIGHT AXILLARY SENTINEL LYMPH NODE BIOPSY;  Surgeon: Rolm Bookbinder, MD;  Location: [facility];  Service: General;  Laterality: Right;   REMOVAL OF TISSUE EXPANDER AND PLACEMENT OF IMPLANT Right 07/31/2021   Procedure: REMOVAL OF TISSUE EXPANDER AND PLACEMENT OF IMPLANT;  Surgeon: Irene Limbo, MD;  Location: [facility];  Service: Plastics;  Laterality: Right;    I have reviewed the social history and family history with the patient and they are unchanged from previous note.  ALLERGIES:  is allergic to sulfonamide derivatives.  MEDICATIONS:  Current Outpatient Medications  Medication Sig Dispense Refill   tamoxifen (NOLVADEX) 20 MG tablet TAKE 1 TABLET BY MOUTH EVERY DAY 90 tablet 1   No current facility-administered medications for this visit.    PHYSICAL EXAMINATION: ECOG PERFORMANCE STATUS: 0 - Asymptomatic  Vitals:   10/15/21 1445  BP: 106/72  Pulse: 71  Resp: 17  SpO2: 100%   Wt Readings from Last 3 Encounters:  10/15/21 156 lb  2 oz (70.8 kg)  07/31/21 158 lb 1.1 oz (71.7 kg)  07/15/21 161 lb 9.6 oz (73.3 kg)     GENERAL:alert, no distress and comfortable SKIN: skin color, texture, turgor are normal, no rashes or significant lesions EYES: normal, Conjunctiva are pink and non-injected, sclera clear  NECK: supple, thyroid normal size, non-tender, without nodularity LYMPH:  no palpable lymphadenopathy in the cervical, axillary LUNGS: clear to auscultation and percussion with normal breathing effort HEART: regular rate & rhythm and no murmurs and no lower extremity edema ABDOMEN:abdomen soft, non-tender and normal bowel sounds Musculoskeletal:no cyanosis of digits and no clubbing  NEURO: alert & oriented x 3 with fluent speech, no focal motor/sensory deficits BREAST: No palpable mass, nodules or adenopathy bilaterally. Breast exam benign.   LABORATORY DATA:  I have reviewed the data as listed    Latest Ref Rng & Units 10/15/2021    1:42 PM 02/11/2021    8:20 AM 06/13/2012    8:30 AM  CBC  WBC 4.0 - 10.5 K/uL 5.8  4.7  9.7   Hemoglobin 12.0 - 15.0 g/dL 12.1  12.0  12.2   Hematocrit 36.0 - 46.0 % 35.4  35.2  36.3   Platelets 150 - 400 K/uL  135  117  134         Latest Ref Rng & Units 10/15/2021    1:42 PM 02/11/2021    8:20 AM 06/13/2012    8:30 AM  CMP  Glucose 70 - 99 mg/dL 77  84  111   BUN 6 - 20 mg/dL '10  12  13   ' Creatinine 0.44 - 1.00 mg/dL 0.91  0.85  0.80   Sodium 135 - 145 mmol/L 134  140  137   Potassium 3.5 - 5.1 mmol/L 4.1  4.1  3.4   Chloride 98 - 111 mmol/L 102  110  104   CO2 22 - 32 mmol/L '24  23  22   ' Calcium 8.9 - 10.3 mg/dL 9.5  8.9  9.4   Total Protein 6.5 - 8.1 g/dL 7.2  6.9  7.4   Total Bilirubin 0.3 - 1.2 mg/dL 0.7  0.5  0.3   Alkaline Phos 38 - 126 U/L 33  40  49   AST 15 - 41 U/L '16  14  14   ' ALT 0 - 44 U/L '9  10  12       ' RADIOGRAPHIC STUDIES: I have personally reviewed the radiological images as listed and agreed with the findings in the report. No results found.     No orders of the defined types were placed in this encounter.  All questions were answered. The patient knows to call the clinic with any problems, questions or concerns. No barriers to learning was detected. The total time spent in the appointment was 25 minutes.     Truitt Merle, MD 10/15/2021   I, Wilburn Mylar, am acting as scribe for Truitt Merle, MD.   I have reviewed the above documentation for accuracy and completeness, and I agree with the above.

## 2021-10-16 ENCOUNTER — Telehealth: Payer: Self-pay | Admitting: Hematology

## 2021-10-16 NOTE — Telephone Encounter (Signed)
Left message with follow-up appointment per 7/20 los.

## 2021-11-16 ENCOUNTER — Encounter: Payer: Self-pay | Admitting: Hematology

## 2022-01-22 ENCOUNTER — Ambulatory Visit
Admission: RE | Admit: 2022-01-22 | Discharge: 2022-01-22 | Disposition: A | Discharge status: Home / Self Care | Payer: Managed Care, Other (non HMO) | Source: Ambulatory Visit | Attending: Nurse Practitioner | Admitting: Nurse Practitioner

## 2022-01-22 DIAGNOSIS — C50211 Malignant neoplasm of upper-inner quadrant of right female breast: Secondary | ICD-10-CM

## 2022-03-16 ENCOUNTER — Encounter: Payer: Self-pay | Admitting: Hematology

## 2022-04-18 ENCOUNTER — Other Ambulatory Visit: Payer: Self-pay | Admitting: Hematology

## 2022-06-02 ENCOUNTER — Encounter: Payer: Self-pay | Admitting: Hematology

## 2022-06-03 ENCOUNTER — Other Ambulatory Visit: Payer: Self-pay

## 2022-06-16 ENCOUNTER — Inpatient Hospital Stay: Payer: Managed Care, Other (non HMO) | Attending: Nurse Practitioner | Admitting: Nurse Practitioner

## 2022-06-16 ENCOUNTER — Encounter: Payer: Self-pay | Admitting: Nurse Practitioner

## 2022-06-16 ENCOUNTER — Inpatient Hospital Stay: Payer: Managed Care, Other (non HMO)

## 2022-06-16 VITALS — BP 106/72 | HR 72 | Temp 98.1°F | Resp 16 | Ht 67.0 in | Wt 161.1 lb

## 2022-06-16 DIAGNOSIS — D696 Thrombocytopenia, unspecified: Secondary | ICD-10-CM | POA: Diagnosis not present

## 2022-06-16 DIAGNOSIS — C50211 Malignant neoplasm of upper-inner quadrant of right female breast: Secondary | ICD-10-CM | POA: Insufficient documentation

## 2022-06-16 DIAGNOSIS — Z170 Estrogen receptor positive status [ER+]: Secondary | ICD-10-CM

## 2022-06-16 DIAGNOSIS — Z79810 Long term (current) use of selective estrogen receptor modulators (SERMs): Secondary | ICD-10-CM | POA: Insufficient documentation

## 2022-06-16 LAB — CBC WITH DIFFERENTIAL (CANCER CENTER ONLY)
Abs Immature Granulocytes: 0.01 10*3/uL (ref 0.00–0.07)
Basophils Absolute: 0 10*3/uL (ref 0.0–0.1)
Basophils Relative: 0 %
Eosinophils Absolute: 0 10*3/uL (ref 0.0–0.5)
Eosinophils Relative: 1 %
HCT: 36.7 % (ref 36.0–46.0)
Hemoglobin: 12.3 g/dL (ref 12.0–15.0)
Immature Granulocytes: 0 %
Lymphocytes Relative: 29 %
Lymphs Abs: 1.6 10*3/uL (ref 0.7–4.0)
MCH: 30.2 pg (ref 26.0–34.0)
MCHC: 33.5 g/dL (ref 30.0–36.0)
MCV: 90.2 fL (ref 80.0–100.0)
Monocytes Absolute: 0.3 10*3/uL (ref 0.1–1.0)
Monocytes Relative: 5 %
Neutro Abs: 3.5 10*3/uL (ref 1.7–7.7)
Neutrophils Relative %: 65 %
Platelet Count: 133 10*3/uL — ABNORMAL LOW (ref 150–400)
RBC: 4.07 MIL/uL (ref 3.87–5.11)
RDW: 11.7 % (ref 11.5–15.5)
WBC Count: 5.5 10*3/uL (ref 4.0–10.5)
nRBC: 0 % (ref 0.0–0.2)

## 2022-06-16 LAB — CMP (CANCER CENTER ONLY)
ALT: 12 U/L (ref 0–44)
AST: 15 U/L (ref 15–41)
Albumin: 4 g/dL (ref 3.5–5.0)
Alkaline Phosphatase: 38 U/L (ref 38–126)
Anion gap: 5 (ref 5–15)
BUN: 8 mg/dL (ref 6–20)
CO2: 28 mmol/L (ref 22–32)
Calcium: 9.4 mg/dL (ref 8.9–10.3)
Chloride: 106 mmol/L (ref 98–111)
Creatinine: 0.84 mg/dL (ref 0.44–1.00)
GFR, Estimated: >60 mL/min (ref >60)
Glucose, Bld: 109 mg/dL — ABNORMAL HIGH (ref 70–99)
Potassium: 4.3 mmol/L (ref 3.5–5.1)
Sodium: 139 mmol/L (ref 135–145)
Total Bilirubin: 0.5 mg/dL (ref 0.3–1.2)
Total Protein: 6.9 g/dL (ref 6.5–8.1)

## 2022-06-16 NOTE — Progress Notes (Signed)
Patient Care Team: Pcp, No as PCP - General Mauro Kaufmann, RN as Oncology Nurse Navigator Rockwell Germany, RN as Oncology Nurse Navigator Rolm Bookbinder, MD as Consulting Physician (General Surgery) Truitt Merle, MD as Consulting Physician (Hematology) Gery Pray, MD as Consulting Physician (Radiation Oncology) Alla Feeling, NP as Nurse Practitioner (Nurse Practitioner)   CHIEF COMPLAINT: Follow up right breast cancer   Oncology History Overview Note  Cancer Staging Malignant neoplasm of upper-inner quadrant of right breast in female, estrogen receptor positive [facility]) Staging form: Breast, AJCC 8th Edition - Clinical stage from 02/03/2021: Stage IA (cT1c, cN0, cM0, G2, ER+, PR+, HER2-) - Signed by Truitt Merle, MD on 02/10/2021    Malignant neoplasm of upper-inner quadrant of right breast in female, estrogen receptor positive ([place])  01/29/2021 Mammogram   EXAM: DIGITAL DIAGNOSTIC UNILATERAL RIGHT MAMMOGRAM WITH TOMOSYNTHESIS AND CAD; ULTRASOUND RIGHT BREAST LIMITED  IMPRESSION: Highly suspicious right breast mass measuring 13 x 14 x 12 mm at 1 o'clock with associated distortion. There are calcifications in the mass. Calcifications are seen anterior and posterior to the mass as well.   02/03/2021 Cancer Staging   Staging form: Breast, AJCC 8th Edition - Clinical stage from 02/03/2021: Stage IA (cT1c, cN0, cM0, G2, ER+, PR+, HER2-) - Signed by Truitt Merle, MD on 02/10/2021 Stage prefix: Initial diagnosis Histologic grading system: 3 grade system   02/03/2021 Pathology Results   Diagnosis Breast, right, needle core biopsy, 1 o'clock mass, ribbon clip - INVASIVE MAMMARY CARCINOMA - MAMMARY CARCINOMA IN SITU - SEE COMMENT Microscopic Comment The biopsy material shows an infiltrative proliferation of cells arranged linearly and in small clusters. Based on the biopsy, the carcinoma appears Nottingham grade 2 of 3 and measures 1.1 cm in greatest linear extent.  E-cadherin is  POSITIVE supporting a ductal origin.  PROGNOSTIC INDICATORS Results: The tumor cells are EQUIVOCAL for Her2 (2+). Her2 by FISH will be performed and results reported separately. Estrogen Receptor: 100%, POSITIVE, STRONG STAINING INTENSITY Progesterone Receptor: 100%, POSITIVE, STRONG STAINING INTENSITY Proliferation Marker Ki67: 5%  FLUORESCENCE IN-SITU HYBRIDIZATION Results: GROUP 5: HER2 **NEGATIVE**   02/06/2021 Initial Diagnosis   Malignant neoplasm of upper-inner quadrant of right breast in female, estrogen receptor positive ([place])   02/13/2021 Imaging   EXAM: BILATERAL BREAST MRI WITH AND WITHOUT CONTRAST  IMPRESSION: 1. Biopsy-proven malignancy over the right upper inner quadrant measuring 1.6 x 1.8 x 1.2 cm.   2. Focal non mass enhancement over the middle third of the right outer lower quadrant measuring 8 x 10 x 7 mm.   3.  No suspicious findings within the left breast.   02/21/2021 Genetic Testing   Negative hereditary cancer genetic testing: no pathogenic variants detected in [facility] CustomNext-Cancer +RNAinsight Panel.  The report date is February 21, 2021.   The CustomNext-Cancer+RNAinsight panel offered by [facility] includes sequencing and rearrangement analysis for the following 47 genes:  APC, ATM, AXIN2, BARD1, BMPR1A, BRCA1, BRCA2, BRIP1, CDH1, CDK4, CDKN2A, CHEK2, DICER1, EPCAM, GREM1, HOXB13, MEN1, MLH1, MSH2, MSH3, MSH6, MUTYH, NBN, NF1, NF2, NTHL1, PALB2, PMS2, POLD1, POLE, PTEN, RAD51C, RAD51D, RECQL, RET, SDHA, SDHAF2, SDHB, SDHC, SDHD, SMAD4, SMARCA4, STK11, TP53, TSC1, TSC2, and VHL.  RNA data is routinely analyzed for use in variant interpretation for all genes.    02/23/2021 Pathology Results   Diagnosis Breast, right, needle core biopsy, lateral, cylinder clip - FOCAL PSEUDOANGIOMATOUS STROMAL HYPERPLASIA - NO MALIGNANCY IDENTIFIED   03/20/2021 Cancer Staging   Staging form: Breast, AJCC 8th Edition -  Pathologic stage from 03/20/2021: Stage IA  (pT1c, pN0, cM0, G2, ER+, PR+, HER2-) - Signed by Truitt Merle, MD on 04/17/2021 Stage prefix: Initial diagnosis Multigene prognostic tests performed: Oncotype DX DCIS Histologic grading system: 3 grade system Residual tumor (R): R0 - None    Pathology Results   Diagnosis 1. Breast, right, needle core biopsy, UIQ, posterior depth, x clip - DUCTAL CARCINOMA IN SITU WITH CALCIFICATIONS - SEE COMMENT 2. Breast, right, needle core biopsy, UIQ, anterior, coil clip - DUCTAL CARCINOMA IN SITU - FIBROADENOMATOID AND FIBROCYSTIC CHANGES WITH MICROCALCIFICATIONS - SEE COMMENT  Microscopic Comment 1. Based on the biopsy, the ductal carcinoma in situ has a solid pattern, intermediate nuclear grade and measures 0.3 cm in greatest linear extent  1. PROGNOSTIC INDICATORS Results: Estrogen Receptor: 100%, POSITIVE, STRONG STAINING INTENSITY Progesterone Receptor: 100%, POSITIVE, STRONG STAINING INTENSITY   03/20/2021 Definitive Surgery   FINAL MICROSCOPIC DIAGNOSIS:   A. BREAST, RIGHT, MASTECTOMY:  - Invasive ductal carcinoma, grade 2, 1.9 cm in maximal extent.  - Invasive tumor is 2.9 cm from deep margin.  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  - Biopsy site changes.   B. LYMPH NODE, SENTINEL, RIGHT AXILLARY, EXCISION:  - 1 lymph node, negative for carcinoma (0/1).   C. LYMPH NODE, SENTINEL, RIGHT AXILLARY, EXCISION:  - 1 lymph node, negative for carcinoma (0/1).   D. LYMPH NODE, SENTINEL, RIGHT AXILLARY, EXCISION:  - 1 lymph node, negative for carcinoma (0/1).   E. LYMPH NODE, SENTINEL, RIGHT AXILLARY, EXCISION:  - 1 lymph node, negative for carcinoma (0/1).    03/2021 -  Anti-estrogen oral therapy   Began tamoxifen 20 mg p.o. once daily, plan for 10 years   07/15/2021 Survivorship   SCP delivered by Cira Rue, NP      CURRENT THERAPY: Tamoxifen, starting 04/17/21  INTERVAL HISTORY Ms. Pettibone returns for follow up as scheduled. Last seen by Dr. Burr Medico 10/15/21.  Mammogram 01/22/22 was benign, breast density cat C. She continues tamoxifen, only side effect is hot flashes/night sweats. These fluctuate from moderate to severe, at one time having up to 30 per day but hasn't been that bad in several months.  Tolerable for now.  Denies any other side effects.  LMP in December 2023.  ROS  All other systems reviewed and negative  Past Medical History:  Diagnosis Date   Cancer ([place]) 02/12/2021   breast   Family history of breast cancer 02/13/2021   Palpitations 05/23/2019   PONV (postoperative nausea and vomiting)    SOB (shortness of breath)      Past Surgical History:  Procedure Laterality Date   BREAST RECONSTRUCTION WITH PLACEMENT OF TISSUE EXPANDER AND ALLODERM Right 03/20/2021   Procedure: RIGHT BREAST RECONSTRUCTION WITH PLACEMENT OF TISSUE EXPANDER AND ALLODERM;  Surgeon: Irene Limbo, MD;  Location: [facility];  Service: Plastics;  Laterality: Right;   BREAST REDUCTION SURGERY Left 07/31/2021   Procedure: MAMMARY REDUCTION  (BREAST);  Surgeon: Irene Limbo, MD;  Location: [facility];  Service: Plastics;  Laterality: Left;   CESAREAN SECTION     CHOLECYSTECTOMY N/A 06/13/2012   Procedure: LAPAROSCOPIC CHOLECYSTECTOMY WITH INTRAOPERATIVE CHOLANGIOGRAM;  Surgeon: Earnstine Regal, MD;  Location: [facility];  Service: General;  Laterality: N/A;   FOOT SURGERY     LIPOSUCTION WITH LIPOFILLING Right 07/31/2021   Procedure: LIPOFILLING FROM ABDOMEN TO RIGHT CHEST;  Surgeon: Irene Limbo, MD;  Location: [place];  Service: Plastics;  Laterality: Right;  MASTECTOMY W/ SENTINEL NODE BIOPSY Right 03/20/2021   Procedure: RIGHT MASTECTOMY WITH RIGHT AXILLARY SENTINEL LYMPH NODE BIOPSY;  Surgeon: Rolm Bookbinder, MD;  Location: [place];  Service: General;  Laterality: Right;   REDUCTION MAMMAPLASTY Left 07/31/2021   REMOVAL OF TISSUE EXPANDER AND PLACEMENT OF IMPLANT Right  07/31/2021   Procedure: REMOVAL OF TISSUE EXPANDER AND PLACEMENT OF IMPLANT;  Surgeon: Irene Limbo, MD;  Location: [facility];  Service: Plastics;  Laterality: Right;     Outpatient Encounter Medications as of 06/16/2022  Medication Sig   tamoxifen (NOLVADEX) 20 MG tablet TAKE 1 TABLET BY MOUTH EVERY DAY   No facility-administered encounter medications on file as of 06/16/2022.     Today's Vitals   06/16/22 0940  BP: 106/72  Pulse: 72  Resp: 16  Temp: 98.1 F (36.7 C)  TempSrc: Temporal  SpO2: 100%  Weight: 161 lb 1.6 oz (73.1 kg)  Height: 5\' 7"  (1.702 m)   Body mass index is 25.23 kg/m.   PHYSICAL EXAM GENERAL:alert, no distress and comfortable SKIN: no rash  EYES: sclera clear NECK: without mass LYMPH:  no palpable cervical or supraclavicular lymphadenopathy  LUNGS: normal breathing effort HEART: no lower extremity edema ABDOMEN: abdomen soft, non-tender  NEURO: alert & oriented x 3 with fluent speech, no focal motor/sensory deficits Breast exam: s/p right mastectomy and bilateral reconstruction. Incisions completely healed. No palpable mass or nodularity in the right reconstructed breast or chest wall, left breast, or either axilla that I could appreciate    CBC    Component Value Date/Time   WBC 5.5 06/16/2022 0922   WBC 9.7 06/13/2012 0830   RBC 4.07 06/16/2022 0922   HGB 12.3 06/16/2022 0922   HCT 36.7 06/16/2022 0922   PLT 133 (L) 06/16/2022 0922   MCV 90.2 06/16/2022 0922   MCH 30.2 06/16/2022 0922   MCHC 33.5 06/16/2022 0922   RDW 11.7 06/16/2022 0922   LYMPHSABS 1.6 06/16/2022 0922   MONOABS 0.3 06/16/2022 0922   EOSABS 0.0 06/16/2022 0922   BASOSABS 0.0 06/16/2022 0922     CMP     Component Value Date/Time   NA 139 06/16/2022 0922   K 4.3 06/16/2022 0922   CL 106 06/16/2022 0922   CO2 28 06/16/2022 0922   GLUCOSE 109 (H) 06/16/2022 0922   BUN 8 06/16/2022 0922   CREATININE 0.84 06/16/2022 0922   CALCIUM 9.4 06/16/2022  0922   PROT 6.9 06/16/2022 0922   ALBUMIN 4.0 06/16/2022 0922   AST 15 06/16/2022 0922   ALT 12 06/16/2022 0922   ALKPHOS 38 06/16/2022 0922   BILITOT 0.5 06/16/2022 0922   GFRNONAA >60 06/16/2022 0922   GFRAA >90 06/13/2012 0830     ASSESSMENT & PLAN:Janet Snyder is a 47 y.o. pre-menopausal female with    1. Malignant neoplasm of upper-inner quadrant of right breast, Stage IA, p(T1c, N0), ER+/PR+/HER2-, Grade 2  -Diagnosed 01/2021, S/p right mastectomy. She did not require postmastectomy radiation.  -Oncotype RS of 13, low risk; chemo was not indicated  -she started tamoxifen on 04/17/21, goal 10 years. (Or consider switching to AI when postmenopausal). -Tolerating mostly well except moderate to hot flashes. -We reviewed symptom management, she prefers to hold on any medication such as gabapentin or SSRI.  I recommend to consider acupuncture.  She will let me know if these get worse -LMP in December 2023, we discussed she may be perimenopausal and this can also cause hot flashes -Breast exam  is benign, labs are stable.  Overall no clinical concern for recurrence -Continue breast cancer surveillance and tamoxifen -Follow-up in 6 months, or sooner if needed   2. Thrombocytopenia -Since at least 2013 by epic records -Stable and mild; possible ITP   3.  Health maintenance -She is up-to-date on cancer screenings, colonoscopy 04/2021 and pelvic/Pap per OB/GYN -There is a PCP onsite at work who checks routine labs    PLAN: -Labs reviewed -Continue breast cancer surveillance and Tamoxifen -F/up in 6 months or sooner if needed -L mammo in 12/2022   Orders Placed This Encounter  Procedures   MM 3D SCREENING MAMMOGRAM UNILATERAL LEFT BREAST    Standing Status:   Future    Standing Expiration Date:   06/16/2023    Order Specific Question:   Reason for Exam (SYMPTOM  OR DIAGNOSIS REQUIRED)    Answer:   R breast cancer s/p mastectomy 01/2021    Order Specific Question:   Is the  patient pregnant?    Answer:   No    Order Specific Question:   Preferred imaging location?    Answer:   [facility]      All questions were answered. The patient knows to call the clinic with any problems, questions or concerns. No barriers to learning were detected.  Cira Rue, NP-C 06/16/2022

## 2022-10-15 ENCOUNTER — Other Ambulatory Visit: Payer: Self-pay | Admitting: Hematology

## 2022-12-15 ENCOUNTER — Other Ambulatory Visit: Payer: Self-pay

## 2022-12-15 DIAGNOSIS — C50211 Malignant neoplasm of upper-inner quadrant of right female breast: Secondary | ICD-10-CM

## 2022-12-16 ENCOUNTER — Encounter: Payer: Self-pay | Admitting: Hematology

## 2022-12-16 ENCOUNTER — Inpatient Hospital Stay: Payer: Managed Care, Other (non HMO) | Admitting: Hematology

## 2022-12-16 ENCOUNTER — Inpatient Hospital Stay: Payer: Managed Care, Other (non HMO) | Attending: Hematology

## 2022-12-16 VITALS — BP 103/75 | HR 72 | Temp 98.7°F | Resp 17 | Ht 67.0 in | Wt 161.9 lb

## 2022-12-16 DIAGNOSIS — D696 Thrombocytopenia, unspecified: Secondary | ICD-10-CM | POA: Diagnosis not present

## 2022-12-16 DIAGNOSIS — Z79810 Long term (current) use of selective estrogen receptor modulators (SERMs): Secondary | ICD-10-CM | POA: Insufficient documentation

## 2022-12-16 DIAGNOSIS — D649 Anemia, unspecified: Secondary | ICD-10-CM | POA: Diagnosis not present

## 2022-12-16 DIAGNOSIS — C50211 Malignant neoplasm of upper-inner quadrant of right female breast: Secondary | ICD-10-CM | POA: Diagnosis present

## 2022-12-16 DIAGNOSIS — Z9011 Acquired absence of right breast and nipple: Secondary | ICD-10-CM | POA: Diagnosis not present

## 2022-12-16 DIAGNOSIS — Z170 Estrogen receptor positive status [ER+]: Secondary | ICD-10-CM | POA: Diagnosis not present

## 2022-12-16 LAB — CMP (CANCER CENTER ONLY)
ALT: 10 U/L (ref 0–44)
AST: 16 U/L (ref 15–41)
Albumin: 4 g/dL (ref 3.5–5.0)
Alkaline Phosphatase: 38 U/L (ref 38–126)
Anion gap: 4 — ABNORMAL LOW (ref 5–15)
BUN: 8 mg/dL (ref 6–20)
CO2: 29 mmol/L (ref 22–32)
Calcium: 9.5 mg/dL (ref 8.9–10.3)
Chloride: 106 mmol/L (ref 98–111)
Creatinine: 0.83 mg/dL (ref 0.44–1.00)
GFR, Estimated: >60 mL/min (ref >60)
Glucose, Bld: 83 mg/dL (ref 70–99)
Potassium: 4.1 mmol/L (ref 3.5–5.1)
Sodium: 139 mmol/L (ref 135–145)
Total Bilirubin: 0.4 mg/dL (ref 0.3–1.2)
Total Protein: 7 g/dL (ref 6.5–8.1)

## 2022-12-16 LAB — CBC WITH DIFFERENTIAL (CANCER CENTER ONLY)
Abs Immature Granulocytes: 0.01 10*3/uL (ref 0.00–0.07)
Basophils Absolute: 0 10*3/uL (ref 0.0–0.1)
Basophils Relative: 0 %
Eosinophils Absolute: 0.1 10*3/uL (ref 0.0–0.5)
Eosinophils Relative: 1 %
HCT: 34.9 % — ABNORMAL LOW (ref 36.0–46.0)
Hemoglobin: 11.6 g/dL — ABNORMAL LOW (ref 12.0–15.0)
Immature Granulocytes: 0 %
Lymphocytes Relative: 43 %
Lymphs Abs: 2.5 10*3/uL (ref 0.7–4.0)
MCH: 29.9 pg (ref 26.0–34.0)
MCHC: 33.2 g/dL (ref 30.0–36.0)
MCV: 89.9 fL (ref 80.0–100.0)
Monocytes Absolute: 0.3 10*3/uL (ref 0.1–1.0)
Monocytes Relative: 5 %
Neutro Abs: 3 10*3/uL (ref 1.7–7.7)
Neutrophils Relative %: 51 %
Platelet Count: 128 10*3/uL — ABNORMAL LOW (ref 150–400)
RBC: 3.88 MIL/uL (ref 3.87–5.11)
RDW: 11.9 % (ref 11.5–15.5)
WBC Count: 5.8 10*3/uL (ref 4.0–10.5)
nRBC: 0 % (ref 0.0–0.2)

## 2022-12-16 NOTE — Progress Notes (Signed)
[facility]   [phone] [phone]   Clinic Follow up Note   Patient Care Team: Pcp, No as PCP - General Pershing Proud, RN as Oncology Nurse Navigator Donnelly Angelica, RN as Oncology Nurse Navigator Emelia Loron, MD as Consulting Physician (General Surgery) Malachy Mood, MD as Consulting Physician (Hematology) Antony Blackbird, MD as Consulting Physician (Radiation Oncology) Pollyann Samples, NP as Nurse Practitioner (Nurse Practitioner)  Date of Service:  12/16/2022  CHIEF COMPLAINT: f/u of right breast cancer  CURRENT THERAPY:  Adjuvant tamoxifen  ASSESSMENT:  Janet Snyder is a 47 y.o. female with   Malignant neoplasm of upper-inner quadrant of right breast in female, estrogen receptor positive (HCC) Stage IA, p(T1c, N0), ER+/PR+/HER2-, Grade 2  -Diagnosed 01/2021, S/p right mastectomy. She did not require postmastectomy radiation.  -Oncotype RS of 13, low risk; chemo was not indicated  -she started tamoxifen on 04/17/21, goal 10 years. -Patient is on Tamoxifen with reported side effects of night sweats and hot flashes, causing sleep disturbances, and some hair loss. No other significant side effects reported. No signs of recurrence on physical examination. -Continue Tamoxifen as prescribed. -Consider non-pharmacological interventions for hot flashes (e.g., keeping room temperature low, using fans).  She is not interested in medical treatment for her hot flashes -Consider referral to a dietician for improved dietary regimen. -Schedule mammogram (screening) for the left breast.  Hair Loss Patient reports hair thinning and loss, possibly related to estrogen changes due to Tamoxifen. -Advise patient to take Biotin supplement.  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 No bleeding issues reported. -Monitor blood counts regularly.  General Health  Maintenance -Advise patient to have cholesterol and A1C checked by primary care provider. -Schedule follow-up visit in six months.     Plan -Continue tamoxifen -Lab and follow-up with APP in 6 months    SUMMARY OF ONCOLOGIC HISTORY: Oncology History Overview Note  Cancer Staging Malignant neoplasm of upper-inner quadrant of right breast in female, estrogen receptor positive (HCC) Staging form: Breast, AJCC 8th Edition - Clinical stage from 02/03/2021: Stage IA (cT1c, cN0, cM0, G2, ER+, PR+, HER2-) - Signed by Malachy Mood, MD on 02/10/2021    Malignant neoplasm of upper-inner quadrant of right breast in female, estrogen receptor positive (HCC)  01/29/2021 Mammogram   EXAM: DIGITAL DIAGNOSTIC UNILATERAL RIGHT MAMMOGRAM WITH TOMOSYNTHESIS AND CAD; ULTRASOUND RIGHT BREAST LIMITED  IMPRESSION: Highly suspicious right breast mass measuring 13 x 14 x 12 mm at 1 o'clock with associated distortion. There are calcifications in the mass. Calcifications are seen anterior and posterior to the mass as well.   02/03/2021 Cancer Staging   Staging form: Breast, AJCC 8th Edition - Clinical stage from 02/03/2021: Stage IA (cT1c, cN0, cM0, G2, ER+, PR+, HER2-) - Signed by Malachy Mood, MD on 02/10/2021 Stage prefix: Initial diagnosis Histologic grading system: 3 grade system   02/03/2021 Pathology Results   Diagnosis Breast, right, needle core biopsy, 1 o'clock mass, ribbon clip - INVASIVE MAMMARY CARCINOMA - MAMMARY CARCINOMA IN SITU - SEE COMMENT Microscopic Comment The biopsy material shows an infiltrative proliferation of cells arranged linearly and in small clusters. Based on the biopsy, the carcinoma appears Nottingham grade 2 of 3 and measures 1.1 cm in greatest linear extent.  E-cadherin is POSITIVE supporting a ductal origin.  PROGNOSTIC INDICATORS Results: The tumor cells are EQUIVOCAL for Her2 (2+). Her2 by FISH will be performed and results reported separately. Estrogen Receptor:  100%,  POSITIVE, STRONG STAINING INTENSITY Progesterone Receptor: 100%, POSITIVE, STRONG STAINING INTENSITY Proliferation Marker Ki67: 5%  FLUORESCENCE IN-SITU HYBRIDIZATION Results: GROUP 5: HER2 **NEGATIVE**   02/06/2021 Initial Diagnosis   Malignant neoplasm of upper-inner quadrant of right breast in female, estrogen receptor positive (HCC)   02/13/2021 Imaging   EXAM: BILATERAL BREAST MRI WITH AND WITHOUT CONTRAST  IMPRESSION: 1. Biopsy-proven malignancy over the right upper inner quadrant measuring 1.6 x 1.8 x 1.2 cm.   2. Focal non mass enhancement over the middle third of the right outer lower quadrant measuring 8 x 10 x 7 mm.   3.  No suspicious findings within the left breast.   02/21/2021 Genetic Testing   Negative hereditary cancer genetic testing: no pathogenic variants detected in [facility] CustomNext-Cancer +RNAinsight Panel.  The report date is February 21, 2021.   The CustomNext-Cancer+RNAinsight panel offered by [employer] includes sequencing and rearrangement analysis for the following 47 genes:  APC, ATM, AXIN2, BARD1, BMPR1A, BRCA1, BRCA2, BRIP1, CDH1, CDK4, CDKN2A, CHEK2, DICER1, EPCAM, GREM1, HOXB13, MEN1, MLH1, MSH2, MSH3, MSH6, MUTYH, NBN, NF1, NF2, NTHL1, PALB2, PMS2, POLD1, POLE, PTEN, RAD51C, RAD51D, RECQL, RET, SDHA, SDHAF2, SDHB, SDHC, SDHD, SMAD4, SMARCA4, STK11, TP53, TSC1, TSC2, and VHL.  RNA data is routinely analyzed for use in variant interpretation for all genes.    02/23/2021 Pathology Results   Diagnosis Breast, right, needle core biopsy, lateral, cylinder clip - FOCAL PSEUDOANGIOMATOUS STROMAL HYPERPLASIA - NO MALIGNANCY IDENTIFIED   03/20/2021 Cancer Staging   Staging form: Breast, AJCC 8th Edition - Pathologic stage from 03/20/2021: Stage IA (pT1c, pN0, cM0, G2, ER+, PR+, HER2-) - Signed by Malachy Mood, MD on 04/17/2021 Stage prefix: Initial diagnosis Multigene prognostic tests performed: Oncotype DX DCIS Histologic grading system: 3 grade  system Residual tumor (R): R0 - None    Pathology Results   Diagnosis 1. Breast, right, needle core biopsy, UIQ, posterior depth, x clip - DUCTAL CARCINOMA IN SITU WITH CALCIFICATIONS - SEE COMMENT 2. Breast, right, needle core biopsy, UIQ, anterior, coil clip - DUCTAL CARCINOMA IN SITU - FIBROADENOMATOID AND FIBROCYSTIC CHANGES WITH MICROCALCIFICATIONS - SEE COMMENT  Microscopic Comment 1. Based on the biopsy, the ductal carcinoma in situ has a solid pattern, intermediate nuclear grade and measures 0.3 cm in greatest linear extent  1. PROGNOSTIC INDICATORS Results: Estrogen Receptor: 100%, POSITIVE, STRONG STAINING INTENSITY Progesterone Receptor: 100%, POSITIVE, STRONG STAINING INTENSITY   03/20/2021 Definitive Surgery   FINAL MICROSCOPIC DIAGNOSIS:   A. BREAST, RIGHT, MASTECTOMY:  - Invasive ductal carcinoma, grade 2, 1.9 cm in maximal extent.  - Invasive tumor is 2.9 cm from deep margin.  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  - Biopsy site changes.   B. LYMPH NODE, SENTINEL, RIGHT AXILLARY, EXCISION:  - 1 lymph node, negative for carcinoma (0/1).   C. LYMPH NODE, SENTINEL, RIGHT AXILLARY, EXCISION:  - 1 lymph node, negative for carcinoma (0/1).   D. LYMPH NODE, SENTINEL, RIGHT AXILLARY, EXCISION:  - 1 lymph node, negative for carcinoma (0/1).   E. LYMPH NODE, SENTINEL, RIGHT AXILLARY, EXCISION:  - 1 lymph node, negative for carcinoma (0/1).    03/2021 -  Anti-estrogen oral therapy   Began tamoxifen 20 mg p.o. once daily, plan for 10 years   07/15/2021 Survivorship   SCP delivered by Santiago Glad, NP      Discussed the use of AI scribe software for clinical note transcription with the patient, who gave verbal consent to proceed.  History of Present  Illness   The patient, currently on tamoxifen therapy, reports experiencing night sweats and hot flashes, which are affecting her sleep. She estimates getting about six hours of sleep per night and  does not feel tired during the day. Despite the discomfort, she is hesitant about taking additional medication for the hot flashes.  The patient also reports hair loss, particularly in the back, while the sides continue to grow normally. She recently cut her hair to even it out due to the back being so broken off. She is considering taking biotin to help with this issue and is concerned that the hair loss may be related to her tamoxifen therapy.  The patient has a history of low platelet count and anemia, which have been stable. She does not report any bleeding issues. She also mentions a concern about weight gain, which she attributes to the tamoxifen slowing down her metabolism.  The patient is also considering taking medication for her hot flashes but is hesitant about adding another medication to her regimen. She expresses a desire to consult with a nutritionist to improve her eating habits.         All other systems were reviewed with the patient and are negative.  MEDICAL HISTORY:  Past Medical History:  Diagnosis Date   Cancer (HCC) 02/12/2021   breast   Family history of breast cancer 02/13/2021   Palpitations 05/23/2019   PONV (postoperative nausea and vomiting)    SOB (shortness of breath)     SURGICAL HISTORY: Past Surgical History:  Procedure Laterality Date   BREAST RECONSTRUCTION WITH PLACEMENT OF TISSUE EXPANDER AND ALLODERM Right 03/20/2021   Procedure: RIGHT BREAST RECONSTRUCTION WITH PLACEMENT OF TISSUE EXPANDER AND ALLODERM;  Surgeon: Glenna Fellows, MD;  Location: [facility];  Service: Plastics;  Laterality: Right;   BREAST REDUCTION SURGERY Left 07/31/2021   Procedure: MAMMARY REDUCTION  (BREAST);  Surgeon: Glenna Fellows, MD;  Location: [facility];  Service: Plastics;  Laterality: Left;   CESAREAN SECTION     CHOLECYSTECTOMY N/A 06/13/2012   Procedure: LAPAROSCOPIC CHOLECYSTECTOMY WITH INTRAOPERATIVE CHOLANGIOGRAM;  Surgeon:  Velora Heckler, MD;  Location: [facility];  Service: General;  Laterality: N/A;   FOOT SURGERY     LIPOSUCTION WITH LIPOFILLING Right 07/31/2021   Procedure: LIPOFILLING FROM ABDOMEN TO RIGHT CHEST;  Surgeon: Glenna Fellows, MD;  Location: [facility];  Service: Plastics;  Laterality: Right;   MASTECTOMY W/ SENTINEL NODE BIOPSY Right 03/20/2021   Procedure: RIGHT MASTECTOMY WITH RIGHT AXILLARY SENTINEL LYMPH NODE BIOPSY;  Surgeon: Emelia Loron, MD;  Location: [facility];  Service: General;  Laterality: Right;   REDUCTION MAMMAPLASTY Left 07/31/2021   REMOVAL OF TISSUE EXPANDER AND PLACEMENT OF IMPLANT Right 07/31/2021   Procedure: REMOVAL OF TISSUE EXPANDER AND PLACEMENT OF IMPLANT;  Surgeon: Glenna Fellows, MD;  Location: [facility];  Service: Plastics;  Laterality: Right;    I have reviewed the social history and family history with the patient and they are unchanged from previous note.  ALLERGIES:  is allergic to sulfonamide derivatives.  MEDICATIONS:  Current Outpatient Medications  Medication Sig Dispense Refill   tamoxifen (NOLVADEX) 20 MG tablet TAKE 1 TABLET BY MOUTH EVERY DAY 90 tablet 1   No current facility-administered medications for this visit.    PHYSICAL EXAMINATION: ECOG PERFORMANCE STATUS: 0 - Asymptomatic  Vitals:   12/16/22 1441  BP: 103/75  Pulse: 72  Resp: 17  Temp: 98.7 F (37.1 C)  SpO2:  100%   Wt Readings from Last 3 Encounters:  12/16/22 161 lb 14.4 oz (73.4 kg)  06/16/22 161 lb 1.6 oz (73.1 kg)  10/15/21 156 lb 2 oz (70.8 kg)     GENERAL:alert, no distress and comfortable SKIN: skin color, texture, turgor are normal, no rashes or significant lesions EYES: normal, Conjunctiva are pink and non-injected, sclera clear NECK: supple, thyroid normal size, non-tender, without nodularity LYMPH:  no palpable lymphadenopathy in the cervical, axillary  LUNGS: clear to auscultation and percussion with  normal breathing effort HEART: regular rate & rhythm and no murmurs and no lower extremity edema ABDOMEN:abdomen soft, non-tender and normal bowel sounds Musculoskeletal:no cyanosis of digits and no clubbing  NECK: No abnormalities. BREAST: right Implant appears normal, no nodules or tenderness. Reduction on left side appears well, no abnormalities.   LABORATORY DATA:  I have reviewed the data as listed    Latest Ref Rng & Units 12/16/2022    2:24 PM 06/16/2022    9:22 AM 10/15/2021    1:42 PM  CBC  WBC 4.0 - 10.5 K/uL 5.8  5.5  5.8   Hemoglobin 12.0 - 15.0 g/dL 13.0  86.5  78.4   Hematocrit 36.0 - 46.0 % 34.9  36.7  35.4   Platelets 150 - 400 K/uL 128  133  135         Latest Ref Rng & Units 12/16/2022    2:24 PM 06/16/2022    9:22 AM 10/15/2021    1:42 PM  CMP  Glucose 70 - 99 mg/dL 83  696  77   BUN 6 - 20 mg/dL 8  8  10    Creatinine 0.44 - 1.00 mg/dL 2.95  2.84  1.32   Sodium 135 - 145 mmol/L 139  139  134   Potassium 3.5 - 5.1 mmol/L 4.1  4.3  4.1   Chloride 98 - 111 mmol/L 106  106  102   CO2 22 - 32 mmol/L 29  28  24    Calcium 8.9 - 10.3 mg/dL 9.5  9.4  9.5   Total Protein 6.5 - 8.1 g/dL 7.0  6.9  7.2   Total Bilirubin 0.3 - 1.2 mg/dL 0.4  0.5  0.7   Alkaline Phos 38 - 126 U/L 38  38  33   AST 15 - 41 U/L 16  15  16    ALT 0 - 44 U/L 10  12  9        RADIOGRAPHIC STUDIES: I have personally reviewed the radiological images as listed and agreed with the findings in the report. No results found.    Orders Placed This Encounter  Procedures   MM 3D DIAGNOSTIC MAMMOGRAM BILATERAL BREAST    Standing Status:   Future    Standing Expiration Date:   12/16/2023    Order Specific Question:   Reason for Exam (SYMPTOM  OR DIAGNOSIS REQUIRED)    Answer:   SCREENING    Order Specific Question:   Preferred imaging location?    Answer:   [facility]    Order Specific Question:   Is the patient pregnant?    Answer:   No   Ambulatory Referral to [facility] Nutrition     Referral Priority:   Routine    Referral Type:   Consultation    Referral Reason:   Specialty Services Required    Number of Visits Requested:   1   All questions were answered. The patient knows to call the clinic with any  problems, questions or concerns. No barriers to learning was detected. The total time spent in the appointment was 25 minutes.     Malachy Mood, MD 12/16/2022

## 2022-12-16 NOTE — Assessment & Plan Note (Signed)
Stage IA, p(T1c, N0), ER+/PR+/HER2-, Grade 2  -Diagnosed 01/2021, S/p right mastectomy. She did not require postmastectomy radiation.  -Oncotype RS of 13, low risk; chemo was not indicated  -she started tamoxifen on 04/17/21, goal 10 years. (Or consider switching to AI when postmenopausal). -Tolerating mostly well except moderate to hot flashes.

## 2022-12-17 ENCOUNTER — Encounter: Payer: Self-pay | Admitting: Hematology

## 2022-12-29 ENCOUNTER — Telehealth: Payer: Self-pay | Admitting: Genetic Counselor

## 2022-12-29 NOTE — Telephone Encounter (Signed)
Per IB on 12/22/22 I called patient to schedule genetic counseling and lab. The patient decline, she said she has already done it in 2022

## 2023-01-06 ENCOUNTER — Inpatient Hospital Stay: Payer: Managed Care, Other (non HMO) | Attending: Hematology | Admitting: Dietician

## 2023-01-08 ENCOUNTER — Telehealth: Payer: Self-pay | Admitting: Nurse Practitioner

## 2023-01-08 NOTE — Telephone Encounter (Signed)
Called to FU on recently canceled appointments. Left VM for patient to call back to reschedule.

## 2023-01-18 ENCOUNTER — Other Ambulatory Visit: Payer: Managed Care, Other (non HMO)

## 2023-01-18 ENCOUNTER — Encounter: Payer: Managed Care, Other (non HMO) | Admitting: Genetic Counselor

## 2023-01-24 ENCOUNTER — Ambulatory Visit
Admission: RE | Admit: 2023-01-24 | Discharge: 2023-01-24 | Disposition: A | Discharge status: Home / Self Care | Payer: Managed Care, Other (non HMO) | Source: Ambulatory Visit | Attending: Nurse Practitioner | Admitting: Nurse Practitioner

## 2023-01-24 DIAGNOSIS — C50211 Malignant neoplasm of upper-inner quadrant of right female breast: Secondary | ICD-10-CM

## 2023-01-24 DIAGNOSIS — Z170 Estrogen receptor positive status [ER+]: Secondary | ICD-10-CM

## 2023-02-09 ENCOUNTER — Inpatient Hospital Stay: Payer: Managed Care, Other (non HMO) | Attending: Hematology | Admitting: Dietician

## 2023-02-09 NOTE — Progress Notes (Signed)
Nutrition Assessment   Reason for Assessment: MD referral/pt request   ASSESSMENT: 47 year old female with right breast cancer, estrogen receptor positive. S/p right mastectomy (2022). Patient receiving adjuvant tamoxifen (start 04/17/21). Patient is under the care of Dr. Mosetta Putt  Met with patient in office. She reports tolerating hormone therapy well overall. Patient denies nutrition impact symptoms. Patient reports she does not have good eating patterns. She reports eating 1-2 times/day. Patient skips breakfast. Has a cup of coffee with creamer at work. Lunch is "iffy" - she can go without or grabs a sandwich from the break room (chicken salad, [place]) with chips and ginger ale. Patient recalls a variety of balanced meals for dinner. She likes to finish the evening off with something sweet (bowl of cereal, cookies). Patient reports recently joining a gym and working with a Psychologist, educational. She is now eating 3 times/day and tracking intake on [employer]itness[employer]. Trainer has recommended 1200 kcal/day to burn fat.   Nutrition Focused Physical Exam: deferred    Medications: reviewed    Labs: 9/19 labs reviewed    Anthropometrics:   Height: 5'7" Weight: 161 lb 14.4 oz  Usual Weight: 158-161 last 2 years BMI: 25.63    NUTRITION DIAGNOSIS: Food and nutrition related knowledge deficit related to cancer as evidenced by no prior need for associated nutrition information   INTERVENTION:  Educated on AICR recommendations for reducing risk of recurrence Discussed importance of adequate nutrition/energy intake, recommend increasing daily caloric intake Educated on benefits of including variety of fruits/vegetables and plant-based proteins All questions answered   MONITORING, EVALUATION, GOAL: Patient will adhere to AICR recommendations, incorporate plant-based diet and maintain healthy weight   Next Visit: No follow-up scheduled. Patient encouraged to contact with nutrition  questions/concerns

## 2023-04-16 ENCOUNTER — Other Ambulatory Visit: Payer: Self-pay | Admitting: Hematology

## 2023-06-15 ENCOUNTER — Other Ambulatory Visit: Payer: Self-pay | Admitting: Nurse Practitioner

## 2023-06-15 DIAGNOSIS — Z170 Estrogen receptor positive status [ER+]: Secondary | ICD-10-CM

## 2023-06-15 NOTE — Progress Notes (Signed)
 Patient Care Team: Pcp, No as PCP - General Pershing Proud, RN as Oncology Nurse Navigator Donnelly Angelica, RN as Oncology Nurse Navigator Emelia Loron, MD as Consulting Physician (General Surgery) Malachy Mood, MD as Consulting Physician (Hematology) Antony Blackbird, MD as Consulting Physician (Radiation Oncology) Pollyann Samples, NP as Nurse Practitioner (Nurse Practitioner)  Clinic Day:  06/19/2023  Referring physician: No ref. provider found  ASSESSMENT & PLAN:   Assessment & Plan: Malignant neoplasm of upper-inner quadrant of right breast in female, estrogen receptor positive (HCC) Stage IA, p(T1c, N0), ER+/PR+/HER2-, Grade 2  -Diagnosed 01/2021, S/p right mastectomy. She did not require postmastectomy radiation.  -Oncotype RS of 13, low risk; chemo was not indicated  -she started tamoxifen on 04/17/21, goal 10 years. (Or consider switching to AI when postmenopausal). -Tolerating mostly well except moderate hot flashes. -Unilateral screening mammogram for the left breast done 01/16/2023 with negative results. -Patient is clinically doing well.  Breast exam is benign.  No current clinical concern for recurrence or metastatic disease. -Unilateral screening mammogram due October 2025 -Continue tamoxifen daily -Continue breast cancer surveillance.  Labs and follow-up in 6 months.   Plan:  Labs reviewed.  Stable and mild anemia. Continue tamoxifen daily. Unilateral screening mammogram due in October 2025. Labs with follow-up scheduled for 6 months.  The patient understands the plans discussed today and is in agreement with them.  She knows to contact our office if she develops concerns prior to her next appointment.  I provided 25 minutes of face-to-face time during this encounter and > 50% was spent counseling as documented under my assessment and plan.    Carlean Jews, NP  [facility] - A DE[facility] [address] [place] [address] Dept: [phone] Dept Fax: [phone]   No orders of the defined types were placed in this encounter.     CHIEF COMPLAINT:  CC: Right breast cancer, estrogen receptor positive  Current Treatment: Tamoxifen 20 mg daily - started 04/17/2021 with goal of 10 years of treatment   INTERVAL HISTORY:  Janet Snyder is here today for repeat clinical assessment.  Patient was seen by Dr. Mosetta Putt on 12/16/2022.  Left unilateral mammogram performed 01/24/2023.  Mammogram results were negative.  She continues to take tamoxifen daily.  She does have hot flashes and night sweats which are manageable.  She continues to have menstrual periods however, they are irregular. She denies chest pain, chest pressure, or shortness of breath. He denies headaches or visual disturbances. She denies abdominal pain, nausea, vomiting, or changes in bowel or bladder habits.   She denies fevers or chills. She denies pain. Her appetite is good. Her weight has increased 3 pounds over last 6 months .  I have reviewed the past medical history, past surgical history, social history and family history with the patient and they are unchanged from previous note.  ALLERGIES:  is allergic to sulfonamide derivatives.  MEDICATIONS:  Current Outpatient Medications  Medication Sig Dispense Refill   tamoxifen (NOLVADEX) 20 MG tablet TAKE 1 TABLET BY MOUTH EVERY DAY 90 tablet 1   No current facility-administered medications for this visit.    HISTORY OF PRESENT ILLNESS:   Oncology History Overview Note  Cancer Staging Malignant neoplasm of upper-inner quadrant of right breast in female, estrogen receptor positive (HCC) Staging form: Breast, AJCC 8th Edition - Clinical stage from 02/03/2021: Stage IA (cT1c, cN0, cM0, G2, ER+, PR+,  HER2-) - Signed by Malachy Mood, MD on 02/10/2021    Malignant neoplasm of upper-inner quadrant of right breast in female, estrogen receptor positive (HCC)   01/29/2021 Mammogram   EXAM: DIGITAL DIAGNOSTIC UNILATERAL RIGHT MAMMOGRAM WITH TOMOSYNTHESIS AND CAD; ULTRASOUND RIGHT BREAST LIMITED  IMPRESSION: Highly suspicious right breast mass measuring 13 x 14 x 12 mm at 1 o'clock with associated distortion. There are calcifications in the mass. Calcifications are seen anterior and posterior to the mass as well.   02/03/2021 Cancer Staging   Staging form: Breast, AJCC 8th Edition - Clinical stage from 02/03/2021: Stage IA (cT1c, cN0, cM0, G2, ER+, PR+, HER2-) - Signed by Malachy Mood, MD on 02/10/2021 Stage prefix: Initial diagnosis Histologic grading system: 3 grade system   02/03/2021 Pathology Results   Diagnosis Breast, right, needle core biopsy, 1 o'clock mass, ribbon clip - INVASIVE MAMMARY CARCINOMA - MAMMARY CARCINOMA IN SITU - SEE COMMENT Microscopic Comment The biopsy material shows an infiltrative proliferation of cells arranged linearly and in small clusters. Based on the biopsy, the carcinoma appears Nottingham grade 2 of 3 and measures 1.1 cm in greatest linear extent.  E-cadherin is POSITIVE supporting a ductal origin.  PROGNOSTIC INDICATORS Results: The tumor cells are EQUIVOCAL for Her2 (2+). Her2 by FISH will be performed and results reported separately. Estrogen Receptor: 100%, POSITIVE, STRONG STAINING INTENSITY Progesterone Receptor: 100%, POSITIVE, STRONG STAINING INTENSITY Proliferation Marker Ki67: 5%  FLUORESCENCE IN-SITU HYBRIDIZATION Results: GROUP 5: HER2 **NEGATIVE**   02/06/2021 Initial Diagnosis   Malignant neoplasm of upper-inner quadrant of right breast in female, estrogen receptor positive (HCC)   02/13/2021 Imaging   EXAM: BILATERAL BREAST MRI WITH AND WITHOUT CONTRAST  IMPRESSION: 1. Biopsy-proven malignancy over the right upper inner quadrant measuring 1.6 x 1.8 x 1.2 cm.   2. Focal non mass enhancement over the middle third of the right outer lower quadrant measuring 8 x 10 x 7 mm.   3.  No  suspicious findings within the left breast.   02/21/2021 Genetic Testing   Negative hereditary cancer genetic testing: no pathogenic variants detected in [facility]Next-Cancer +RNAinsight Panel.  The report date is February 21, 2021.   The CustomNext-Cancer+RNAinsight panel offered by [employer] includes sequencing and rearrangement analysis for the following 47 genes:  APC, ATM, AXIN2, BARD1, BMPR1A, BRCA1, BRCA2, BRIP1, CDH1, CDK4, CDKN2A, CHEK2, DICER1, EPCAM, GREM1, HOXB13, MEN1, MLH1, MSH2, MSH3, MSH6, MUTYH, NBN, NF1, NF2, NTHL1, PALB2, PMS2, POLD1, POLE, PTEN, RAD51C, RAD51D, RECQL, RET, SDHA, SDHAF2, SDHB, SDHC, SDHD, SMAD4, SMARCA4, STK11, TP53, TSC1, TSC2, and VHL.  RNA data is routinely analyzed for use in variant interpretation for all genes.    02/23/2021 Pathology Results   Diagnosis Breast, right, needle core biopsy, lateral, cylinder clip - FOCAL PSEUDOANGIOMATOUS STROMAL HYPERPLASIA - NO MALIGNANCY IDENTIFIED   03/20/2021 Cancer Staging   Staging form: Breast, AJCC 8th Edition - Pathologic stage from 03/20/2021: Stage IA (pT1c, pN0, cM0, G2, ER+, PR+, HER2-) - Signed by Malachy Mood, MD on 04/17/2021 Stage prefix: Initial diagnosis Multigene prognostic tests performed: Oncotype DX DCIS Histologic grading system: 3 grade system Residual tumor (R): R0 - None    Pathology Results   Diagnosis 1. Breast, right, needle core biopsy, UIQ, posterior depth, x clip - DUCTAL CARCINOMA IN SITU WITH CALCIFICATIONS - SEE COMMENT 2. Breast, right, needle core biopsy, UIQ, anterior, coil clip - DUCTAL CARCINOMA IN SITU - FIBROADENOMATOID AND FIBROCYSTIC CHANGES WITH MICROCALCIFICATIONS - SEE COMMENT  Microscopic Comment 1. Based on the biopsy, the ductal  carcinoma in situ has a solid pattern, intermediate nuclear grade and measures 0.3 cm in greatest linear extent  1. PROGNOSTIC INDICATORS Results: Estrogen Receptor: 100%, POSITIVE, STRONG STAINING INTENSITY Progesterone  Receptor: 100%, POSITIVE, STRONG STAINING INTENSITY   03/20/2021 Definitive Surgery   FINAL MICROSCOPIC DIAGNOSIS:   A. BREAST, RIGHT, MASTECTOMY:  - Invasive ductal carcinoma, grade 2, 1.9 cm in maximal extent.  - Invasive tumor is 2.9 cm from deep margin.  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  - Biopsy site changes.   B. LYMPH NODE, SENTINEL, RIGHT AXILLARY, EXCISION:  - 1 lymph node, negative for carcinoma (0/1).   C. LYMPH NODE, SENTINEL, RIGHT AXILLARY, EXCISION:  - 1 lymph node, negative for carcinoma (0/1).   D. LYMPH NODE, SENTINEL, RIGHT AXILLARY, EXCISION:  - 1 lymph node, negative for carcinoma (0/1).   E. LYMPH NODE, SENTINEL, RIGHT AXILLARY, EXCISION:  - 1 lymph node, negative for carcinoma (0/1).    03/2021 -  Anti-estrogen oral therapy   Began tamoxifen 20 mg p.o. once daily, plan for 10 years   07/15/2021 Survivorship   SCP delivered by Santiago Glad, NP   01/24/2023 Mammogram   Left unilateral screening mammogram FINDINGS: The patient has had a right mastectomy. There are no findings suspicious for malignancy.   IMPRESSION: No mammographic evidence of malignancy.  RECOMMENDATION: Screening mammogram in one year.  (Code:SM-L-10M)  BI-RADS CATEGORY  1: Negative.       REVIEW OF SYSTEMS:   Constitutional: Denies fevers, chills or abnormal weight loss Eyes: Denies blurriness of vision Ears, nose, mouth, throat, and face: Denies mucositis or sore throat Respiratory: Denies cough, dyspnea or wheezes Cardiovascular: Denies palpitation, chest discomfort or lower extremity swelling Gastrointestinal:  Denies nausea, heartburn or change in bowel habits Skin: Denies abnormal skin rashes Lymphatics: Denies new lymphadenopathy or easy bruising Neurological:Denies numbness, tingling or new weaknesses Behavioral/Psych: Mood is stable, no new changes  All other systems were reviewed with the patient and are negative.   VITALS:   Today's Vitals    06/16/23 0838  BP: 118/70  Pulse: 71  Resp: 17  Temp: (!) 97.3 F (36.3 C)  SpO2: 99%  Weight: 164 lb 4.8 oz (74.5 kg)  PainSc: 0-No pain   Body mass index is 25.73 kg/m.    Wt Readings from Last 3 Encounters:  06/16/23 164 lb 4.8 oz (74.5 kg)  12/16/22 161 lb 14.4 oz (73.4 kg)  06/16/22 161 lb 1.6 oz (73.1 kg)    Body mass index is 25.73 kg/m.  Performance status (ECOG): 1 - Symptomatic but completely ambulatory  PHYSICAL EXAM:   GENERAL:alert, no distress and comfortable SKIN: skin color, texture, discharge are normal, no rashes or significant lesions EYES: normal, Conjunctiva are pink and non-injected, sclera clear OROPHARYNX:no exudate, no erythema and lips, buccal mucosa, and tongue normal  NECK: supple, thyroid normal size, non-tender, without nodularity LYMPH:  no palpable lymphadenopathy in the cervical, axillary or inguinal LUNGS: clear to auscultation and percussion with normal breathing effort HEART: regular rate & rhythm and no murmurs and no lower extremity edema ABDOMEN:abdomen soft, non-tender and normal bowel sounds Musculoskeletal:no cyanosis of digits and no clubbing  NEURO: alert & oriented x 3 with fluent speech, no focal motor/sensory deficits BREAST: Right breast mastectomy with reconstruction.  Nipple is surgically absent.  Well-healed surgical scars.  No lumps or masses in chest wall.  No axillary lymphadenopathy on the right.  Left breast is without lumps or masses.  There is  no nipple inversion or discharge.  There is no axillary lymphadenopathy on the left.  LABORATORY DATA:  I have reviewed the data as listed    Component Value Date/Time   NA 140 06/16/2023 0810   K 4.3 06/16/2023 0810   CL 108 06/16/2023 0810   CO2 28 06/16/2023 0810   GLUCOSE 89 06/16/2023 0810   BUN 11 06/16/2023 0810   CREATININE 0.85 06/16/2023 0810   CALCIUM 8.7 (L) 06/16/2023 0810   PROT 6.4 (L) 06/16/2023 0810   ALBUMIN 3.7 06/16/2023 0810   AST 18 06/16/2023  0810   ALT 19 06/16/2023 0810   ALKPHOS 45 06/16/2023 0810   BILITOT 0.3 06/16/2023 0810   GFRNONAA >60 06/16/2023 0810   GFRAA >90 06/13/2012 0830     Lab Results  Component Value Date   WBC 5.4 06/16/2023   NEUTROABS 3.7 06/16/2023   HGB 11.6 (L) 06/16/2023   HCT 35.0 (L) 06/16/2023   MCV 91.1 06/16/2023   PLT 150 06/16/2023

## 2023-06-15 NOTE — Assessment & Plan Note (Addendum)
 Stage IA, p(T1c, N0), ER+/PR+/HER2-, Grade 2  -Diagnosed 01/2021, S/p right mastectomy. She did not require postmastectomy radiation.  -Oncotype RS of 13, low risk; chemo was not indicated  -she started tamoxifen on 04/17/21, goal 10 years. (Or consider switching to AI when postmenopausal). -Tolerating mostly well except moderate hot flashes. -Unilateral screening mammogram for the left breast done 01/16/2023 with negative results. -Patient is clinically doing well.  Breast exam is benign.  No current clinical concern for recurrence or metastatic disease. -Unilateral screening mammogram due October 2025 -Continue tamoxifen daily -Continue breast cancer surveillance.  Labs and follow-up in 6 months.

## 2023-06-16 ENCOUNTER — Ambulatory Visit: Payer: Managed Care, Other (non HMO) | Admitting: Nurse Practitioner

## 2023-06-16 ENCOUNTER — Inpatient Hospital Stay: Payer: Managed Care, Other (non HMO) | Admitting: Nurse Practitioner

## 2023-06-16 ENCOUNTER — Other Ambulatory Visit: Payer: Managed Care, Other (non HMO)

## 2023-06-16 ENCOUNTER — Inpatient Hospital Stay: Payer: Managed Care, Other (non HMO) | Attending: Hematology

## 2023-06-16 VITALS — BP 118/70 | HR 71 | Temp 97.3°F | Resp 17 | Wt 164.3 lb

## 2023-06-16 DIAGNOSIS — Z170 Estrogen receptor positive status [ER+]: Secondary | ICD-10-CM

## 2023-06-16 DIAGNOSIS — D649 Anemia, unspecified: Secondary | ICD-10-CM | POA: Diagnosis not present

## 2023-06-16 DIAGNOSIS — Z79810 Long term (current) use of selective estrogen receptor modulators (SERMs): Secondary | ICD-10-CM | POA: Diagnosis not present

## 2023-06-16 DIAGNOSIS — C50211 Malignant neoplasm of upper-inner quadrant of right female breast: Secondary | ICD-10-CM

## 2023-06-16 DIAGNOSIS — Z9011 Acquired absence of right breast and nipple: Secondary | ICD-10-CM | POA: Diagnosis present

## 2023-06-16 DIAGNOSIS — R232 Flushing: Secondary | ICD-10-CM | POA: Insufficient documentation

## 2023-06-16 LAB — CMP (CANCER CENTER ONLY)
ALT: 19 U/L (ref 0–44)
AST: 18 U/L (ref 15–41)
Albumin: 3.7 g/dL (ref 3.5–5.0)
Alkaline Phosphatase: 45 U/L (ref 38–126)
Anion gap: 4 — ABNORMAL LOW (ref 5–15)
BUN: 11 mg/dL (ref 6–20)
CO2: 28 mmol/L (ref 22–32)
Calcium: 8.7 mg/dL — ABNORMAL LOW (ref 8.9–10.3)
Chloride: 108 mmol/L (ref 98–111)
Creatinine: 0.85 mg/dL (ref 0.44–1.00)
GFR, Estimated: >60 mL/min (ref >60)
Glucose, Bld: 89 mg/dL (ref 70–99)
Potassium: 4.3 mmol/L (ref 3.5–5.1)
Sodium: 140 mmol/L (ref 135–145)
Total Bilirubin: 0.3 mg/dL (ref 0.0–1.2)
Total Protein: 6.4 g/dL — ABNORMAL LOW (ref 6.5–8.1)

## 2023-06-16 LAB — CBC WITH DIFFERENTIAL (CANCER CENTER ONLY)
Abs Immature Granulocytes: 0.01 10*3/uL (ref 0.00–0.07)
Basophils Absolute: 0 10*3/uL (ref 0.0–0.1)
Basophils Relative: 0 %
Eosinophils Absolute: 0.1 10*3/uL (ref 0.0–0.5)
Eosinophils Relative: 1 %
HCT: 35 % — ABNORMAL LOW (ref 36.0–46.0)
Hemoglobin: 11.6 g/dL — ABNORMAL LOW (ref 12.0–15.0)
Immature Granulocytes: 0 %
Lymphocytes Relative: 26 %
Lymphs Abs: 1.4 10*3/uL (ref 0.7–4.0)
MCH: 30.2 pg (ref 26.0–34.0)
MCHC: 33.1 g/dL (ref 30.0–36.0)
MCV: 91.1 fL (ref 80.0–100.0)
Monocytes Absolute: 0.2 10*3/uL (ref 0.1–1.0)
Monocytes Relative: 4 %
Neutro Abs: 3.7 10*3/uL (ref 1.7–7.7)
Neutrophils Relative %: 69 %
Platelet Count: 150 10*3/uL (ref 150–400)
RBC: 3.84 MIL/uL — ABNORMAL LOW (ref 3.87–5.11)
RDW: 12.2 % (ref 11.5–15.5)
WBC Count: 5.4 10*3/uL (ref 4.0–10.5)
nRBC: 0 % (ref 0.0–0.2)

## 2023-06-19 ENCOUNTER — Encounter: Payer: Self-pay | Admitting: Nurse Practitioner

## 2023-10-26 ENCOUNTER — Other Ambulatory Visit: Payer: Self-pay | Admitting: Hematology

## 2023-12-06 ENCOUNTER — Telehealth: Payer: Self-pay | Admitting: Nurse Practitioner

## 2023-12-06 NOTE — Telephone Encounter (Signed)
 Rescheduled patient appointments from September 22nd. Left a voicemail with new appointment day and times.

## 2023-12-07 ENCOUNTER — Other Ambulatory Visit: Payer: Self-pay | Admitting: Hematology

## 2023-12-07 DIAGNOSIS — Z1231 Encounter for screening mammogram for malignant neoplasm of breast: Secondary | ICD-10-CM

## 2023-12-19 ENCOUNTER — Ambulatory Visit: Admitting: Nurse Practitioner

## 2023-12-19 ENCOUNTER — Other Ambulatory Visit

## 2023-12-28 ENCOUNTER — Other Ambulatory Visit: Payer: Self-pay

## 2023-12-28 DIAGNOSIS — Z170 Estrogen receptor positive status [ER+]: Secondary | ICD-10-CM

## 2023-12-28 NOTE — Progress Notes (Signed)
 Patient Care Team: Pcp, No as PCP - General Tyree Nanetta SAILOR, RN as Oncology Nurse Navigator Ebbie Cough, MD as Consulting Physician (General Surgery) Lanny Callander, MD as Consulting Physician (Hematology) Shannon Agent, MD as Consulting Physician (Radiation Oncology) Burton, Lacie K, NP as Nurse Practitioner (Nurse Practitioner)  Clinic Day:  12/29/2023  Referring physician: No ref. provider found  ASSESSMENT & PLAN:   Assessment & Plan: Malignant neoplasm of upper-inner quadrant of right breast in female, estrogen receptor positive (HCC) Stage IA, p(T1c, N0), ER+/PR+/HER2-, Grade 2  -Diagnosed 01/2021, S/p right mastectomy. She did not require postmastectomy radiation.  -Oncotype RS of 13, low risk; chemo was not indicated  -she started tamoxifen  on 04/17/21, goal 10 years. (Or consider switching to AI when postmenopausal). -Tolerating mostly well except moderate hot flashes. -Unilateral screening mammogram for the left breast done 01/16/2023 with negative results. -Patient is clinically doing well.  Breast exam is benign.  No current clinical concern for recurrence or metastatic disease. -Unilateral left screening mammogram scheduled on 01/27/2024.  Will add ultrasound of the right chest wall to evaluate for abnormalities along surgical scar.  Will message/contact patient with results once they are available. -Continue tamoxifen  daily -Continue breast cancer surveillance.  Labs and follow-up in 6 months.   Right breast pain The patient reports intermittent right breast pain rated at 3/10 in severity.  She is unable to attribute onset of pain to anything in particular.  She does strength training and thinks sometimes, the onset of pain is due to her training.  She did have right mastectomy with reconstruction.  Clinical exam today did not yield any abnormalities of the implant, surrounding chest wall, or right axillary region.  Will add right breast ultrasound (complete) to order  for unilateral left screening mammogram scheduled for 01/27/2024.  Will discuss results with patient when they are available.  Hypoglycemia Mild hypoglycemia with blood sugar of 64.  Patient ate a small, light lunch.  Generally eats only 1 bigger meal every day.  Discussed adding higher protein snacks during the day to help keep blood sugar stable.  She voiced understanding and stated she will try.  Plan Labs reviewed. - Unremarkable CBC - Mild hyperglycemia with remainder of CMP unremarkable. Benign clinical breast exam. Add right breast ultrasound to unilateral left breast mammogram on 01/27/2024 due to intermittent right breast pain. Unilateral left breast mammogram scheduled for 01/27/2024. Continue tamoxifen  20 mg daily. Plan for labs and follow-up in 6 months, sooner if needed.  The patient understands the plans discussed today and is in agreement with them.  She knows to contact our office if she develops concerns prior to her next appointment.  I provided 25 minutes of face-to-face time during this encounter and > 50% was spent counseling as documented under my assessment and plan.    Powell FORBES Lessen, NP  [facility] - A DEPT OF [facility] [address] [place] [address] Dept: [phone] Dept Fax: [phone]   No orders of the defined types were placed in this encounter.     CHIEF COMPLAINT:  CC: Right breast cancer, ER positive  Current Treatment: Tamoxifen  20 mg daily (started 04/25/2021 with goal of 10 years treatment)  INTERVAL HISTORY:  Taiana is here today for repeat clinical assessment.  She last saw me on 06/24/2023.  She reports intermittent right chest wall pain around surgical incision sites.  She denies palpable lumps or masses.  She  denies nipple discharge or nipple inversion.  She denies new findings in the left breast.  She continues to take tamoxifen  20 mg daily.  She has hot  flashes and night sweats which are manageable.  She denies chest pain, chest pressure, or shortness of breath. She denies headaches or visual disturbances. Sjhe denies abdominal pain, nausea, vomiting, or changes in bowel or bladder habits.  She denies fevers or chills. She denies pain. Her appetite is good. Her weight has been stable.  I have reviewed the past medical history, past surgical history, social history and family history with the patient and they are unchanged from previous note.  ALLERGIES:  is allergic to sulfonamide derivatives.  MEDICATIONS:  Current Outpatient Medications  Medication Sig Dispense Refill   Cholecalciferol (VITAMIN D) 50 MCG (2000 UT) CAPS Take 2 capsules by mouth daily.     Omega-3 Fatty Acids (FISH OIL) 600 MG CAPS Take 2 capsules by mouth daily.     tamoxifen  (NOLVADEX ) 20 MG tablet TAKE 1 TABLET BY MOUTH EVERY DAY 90 tablet 1   Vitamins-Lipotropics (MEGA MULTIPLE/CHELATED MINERAL) TABS Take 1 tablet by mouth daily.     No current facility-administered medications for this visit.    HISTORY OF PRESENT ILLNESS:   Oncology History Overview Note  Cancer Staging Malignant neoplasm of upper-inner quadrant of right breast in female, estrogen receptor positive (HCC) Staging form: Breast, AJCC 8th Edition - Clinical stage from 02/03/2021: Stage IA (cT1c, cN0, cM0, G2, ER+, PR+, HER2-) - Signed by Lanny Callander, MD on 02/10/2021    Malignant neoplasm of upper-inner quadrant of right breast in female, estrogen receptor positive (HCC)  01/29/2021 Mammogram   EXAM: DIGITAL DIAGNOSTIC UNILATERAL RIGHT MAMMOGRAM WITH TOMOSYNTHESIS AND CAD; ULTRASOUND RIGHT BREAST LIMITED  IMPRESSION: Highly suspicious right breast mass measuring 13 x 14 x 12 mm at 1 o'clock with associated distortion. There are calcifications in the mass. Calcifications are seen anterior and posterior to the mass as well.   02/03/2021 Cancer Staging   Staging form: Breast, AJCC 8th Edition -  Clinical stage from 02/03/2021: Stage IA (cT1c, cN0, cM0, G2, ER+, PR+, HER2-) - Signed by Lanny Callander, MD on 02/10/2021 Stage prefix: Initial diagnosis Histologic grading system: 3 grade system   02/03/2021 Pathology Results   Diagnosis Breast, right, needle core biopsy, 1 o'clock mass, ribbon clip - INVASIVE MAMMARY CARCINOMA - MAMMARY CARCINOMA IN SITU - SEE COMMENT Microscopic Comment The biopsy material shows an infiltrative proliferation of cells arranged linearly and in small clusters. Based on the biopsy, the carcinoma appears Nottingham grade 2 of 3 and measures 1.1 cm in greatest linear extent.  E-cadherin is POSITIVE supporting a ductal origin.  PROGNOSTIC INDICATORS Results: The tumor cells are EQUIVOCAL for Her2 (2+). Her2 by FISH will be performed and results reported separately. Estrogen Receptor: 100%, POSITIVE, STRONG STAINING INTENSITY Progesterone Receptor: 100%, POSITIVE, STRONG STAINING INTENSITY Proliferation Marker Ki67: 5%  FLUORESCENCE IN-SITU HYBRIDIZATION Results: GROUP 5: HER2 **NEGATIVE**   02/06/2021 Initial Diagnosis   Malignant neoplasm of upper-inner quadrant of right breast in female, estrogen receptor positive (HCC)   02/13/2021 Imaging   EXAM: BILATERAL BREAST MRI WITH AND WITHOUT CONTRAST  IMPRESSION: 1. Biopsy-proven malignancy over the right upper inner quadrant measuring 1.6 x 1.8 x 1.2 cm.   2. Focal non mass enhancement over the middle third of the right outer lower quadrant measuring 8 x 10 x 7 mm.   3.  No suspicious findings within the left breast.   02/21/2021 Genetic Testing  Negative hereditary cancer genetic testing: no pathogenic variants detected in [facility] CustomNext-Cancer +RNAinsight Panel.  The report date is February 21, 2021.   The CustomNext-Cancer+RNAinsight panel offered by [facility] includes sequencing and rearrangement analysis for the following 47 genes:  APC, ATM, AXIN2, BARD1, BMPR1A, BRCA1, BRCA2, BRIP1,  CDH1, CDK4, CDKN2A, CHEK2, DICER1, EPCAM, GREM1, HOXB13, MEN1, MLH1, MSH2, MSH3, MSH6, MUTYH, NBN, NF1, NF2, NTHL1, PALB2, PMS2, POLD1, POLE, PTEN, RAD51C, RAD51D, RECQL, RET, SDHA, SDHAF2, SDHB, SDHC, SDHD, SMAD4, SMARCA4, STK11, TP53, TSC1, TSC2, and VHL.  RNA data is routinely analyzed for use in variant interpretation for all genes.    02/23/2021 Pathology Results   Diagnosis Breast, right, needle core biopsy, lateral, cylinder clip - FOCAL PSEUDOANGIOMATOUS STROMAL HYPERPLASIA - NO MALIGNANCY IDENTIFIED   03/20/2021 Cancer Staging   Staging form: Breast, AJCC 8th Edition - Pathologic stage from 03/20/2021: Stage IA (pT1c, pN0, cM0, G2, ER+, PR+, HER2-) - Signed by Lanny Callander, MD on 04/17/2021 Stage prefix: Initial diagnosis Multigene prognostic tests performed: Oncotype DX DCIS Histologic grading system: 3 grade system Residual tumor (R): R0 - None    Pathology Results   Diagnosis 1. Breast, right, needle core biopsy, UIQ, posterior depth, x clip - DUCTAL CARCINOMA IN SITU WITH CALCIFICATIONS - SEE COMMENT 2. Breast, right, needle core biopsy, UIQ, anterior, coil clip - DUCTAL CARCINOMA IN SITU - FIBROADENOMATOID AND FIBROCYSTIC CHANGES WITH MICROCALCIFICATIONS - SEE COMMENT  Microscopic Comment 1. Based on the biopsy, the ductal carcinoma in situ has a solid pattern, intermediate nuclear grade and measures 0.3 cm in greatest linear extent  1. PROGNOSTIC INDICATORS Results: Estrogen Receptor: 100%, POSITIVE, STRONG STAINING INTENSITY Progesterone Receptor: 100%, POSITIVE, STRONG STAINING INTENSITY   03/20/2021 Definitive Surgery   FINAL MICROSCOPIC DIAGNOSIS:   A. BREAST, RIGHT, MASTECTOMY:  - Invasive ductal carcinoma, grade 2, 1.9 cm in maximal extent.  - Invasive tumor is 2.9 cm from deep margin.  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  - Biopsy site changes.   B. LYMPH NODE, SENTINEL, RIGHT AXILLARY, EXCISION:  - 1 lymph node, negative for carcinoma  (0/1).   C. LYMPH NODE, SENTINEL, RIGHT AXILLARY, EXCISION:  - 1 lymph node, negative for carcinoma (0/1).   D. LYMPH NODE, SENTINEL, RIGHT AXILLARY, EXCISION:  - 1 lymph node, negative for carcinoma (0/1).   E. LYMPH NODE, SENTINEL, RIGHT AXILLARY, EXCISION:  - 1 lymph node, negative for carcinoma (0/1).    03/2021 -  Anti-estrogen oral therapy   Began tamoxifen  20 mg p.o. once daily, plan for 10 years   07/15/2021 Survivorship   SCP delivered by Lacie Burton, NP   01/24/2023 Mammogram   Left unilateral screening mammogram FINDINGS: The patient has had a right mastectomy. There are no findings suspicious for malignancy.   IMPRESSION: No mammographic evidence of malignancy.  RECOMMENDATION: Screening mammogram in one year.  (Code:SM-L-41M)  BI-RADS CATEGORY  1: Negative.       REVIEW OF SYSTEMS:   Constitutional: Denies fevers, chills or abnormal weight loss Eyes: Denies blurriness of vision Ears, nose, mouth, throat, and face: Denies mucositis or sore throat Respiratory: Denies cough, dyspnea or wheezes Cardiovascular: Denies palpitation, chest discomfort or lower extremity swelling Gastrointestinal:  Denies nausea, heartburn or change in bowel habits Skin: Denies abnormal skin rashes Lymphatics: Denies new lymphadenopathy or easy bruising Neurological:Denies numbness, tingling or new weaknesses Behavioral/Psych: Mood is stable, no new changes  Breast: Tenderness along the right chest wall, adjacent to surgical scars from right mastectomy and  reconstructive surgery. All other systems were reviewed with the patient and are negative.   VITALS:   Today's Vitals   12/29/23 1259 12/29/23 1315  BP: 106/79   Pulse: 76   Resp: 17   Temp: 97.9 F (36.6 C)   TempSrc: Temporal   SpO2: 100%   Weight: 164 lb (74.4 kg)   PainSc:  0-No pain   Body mass index is 25.69 kg/m.    Wt Readings from Last 3 Encounters:  12/29/23 164 lb (74.4 kg)  06/16/23 164 lb 4.8 oz (74.5  kg)  12/16/22 161 lb 14.4 oz (73.4 kg)    Body mass index is 25.69 kg/m.  Performance status (ECOG): 1 - Symptomatic but completely ambulatory  PHYSICAL EXAM:   GENERAL:alert, no distress and comfortable SKIN: skin color, texture, turgor are normal, no rashes or significant lesions EYES: normal, Conjunctiva are pink and non-injected, sclera clear OROPHARYNX:no exudate, no erythema and lips, buccal mucosa, and tongue normal  NECK: supple, thyroid normal size, non-tender, without nodularity LYMPH:  no palpable lymphadenopathy in the cervical, axillary or inguinal LUNGS: clear to auscultation and percussion with normal breathing effort HEART: regular rate & rhythm and no murmurs and no lower extremity edema ABDOMEN:abdomen soft, non-tender and normal bowel sounds Musculoskeletal:no cyanosis of digits and no clubbing  NEURO: alert & oriented x 3 with fluent speech, no focal motor/sensory deficits BREAST: Right breast mastectomy with intact implant.  There is tenderness along the chest wall, adjacent to surgical scars.  Fibrous tissue palpable inferior to the surgical scar.  No distinct mass or lumps are noted.  No other abnormalities in the breast.  Nipple surgically absent.  There is no axillary lymphadenopathy on the right.  Left breast is without palpable lumps or masses today.  There is no nipple inversion or nipple discharge.  There is no axillary lymphadenopathy on the left.  LABORATORY DATA:  I have reviewed the data as listed    Component Value Date/Time   NA 141 12/29/2023 1240   K 3.7 12/29/2023 1240   CL 108 12/29/2023 1240   CO2 30 12/29/2023 1240   GLUCOSE 64 (L) 12/29/2023 1240   BUN 11 12/29/2023 1240   CREATININE 0.88 12/29/2023 1240   CALCIUM 9.6 12/29/2023 1240   PROT 7.4 12/29/2023 1240   ALBUMIN 4.1 12/29/2023 1240   AST 17 12/29/2023 1240   ALT 14 12/29/2023 1240   ALKPHOS 50 12/29/2023 1240   BILITOT 0.4 12/29/2023 1240   GFRNONAA >60 12/29/2023 1240    GFRAA >90 06/13/2012 0830    Lab Results  Component Value Date   WBC 5.6 12/29/2023   NEUTROABS 2.9 12/29/2023   HGB 12.7 12/29/2023   HCT 37.5 12/29/2023   MCV 87.2 12/29/2023   PLT 150 12/29/2023

## 2023-12-28 NOTE — Assessment & Plan Note (Addendum)
 Stage IA, p(T1c, N0), ER+/PR+/HER2-, Grade 2  -Diagnosed 01/2021, S/p right mastectomy. She did not require postmastectomy radiation.  -Oncotype RS of 13, low risk; chemo was not indicated  -she started tamoxifen  on 04/17/21, goal 10 years. (Or consider switching to AI when postmenopausal). -Tolerating mostly well except moderate hot flashes. -Unilateral screening mammogram for the left breast done 01/16/2023 with negative results. -Patient is clinically doing well.  Breast exam is benign.  No current clinical concern for recurrence or metastatic disease. -Unilateral left screening mammogram scheduled on 01/27/2024.  Will add ultrasound of the right chest wall to evaluate for abnormalities along surgical scar.  Will message/contact patient with results once they are available. -Continue tamoxifen  daily -Continue breast cancer surveillance.  Labs and follow-up in 6 months.

## 2023-12-29 ENCOUNTER — Inpatient Hospital Stay (HOSPITAL_BASED_OUTPATIENT_CLINIC_OR_DEPARTMENT_OTHER): Admitting: Nurse Practitioner

## 2023-12-29 ENCOUNTER — Inpatient Hospital Stay: Attending: Nurse Practitioner

## 2023-12-29 VITALS — BP 106/79 | HR 76 | Temp 97.9°F | Resp 17 | Wt 164.0 lb

## 2023-12-29 DIAGNOSIS — Z79810 Long term (current) use of selective estrogen receptor modulators (SERMs): Secondary | ICD-10-CM | POA: Diagnosis not present

## 2023-12-29 DIAGNOSIS — C50211 Malignant neoplasm of upper-inner quadrant of right female breast: Secondary | ICD-10-CM

## 2023-12-29 DIAGNOSIS — Z170 Estrogen receptor positive status [ER+]: Secondary | ICD-10-CM | POA: Diagnosis not present

## 2023-12-29 DIAGNOSIS — N644 Mastodynia: Secondary | ICD-10-CM | POA: Insufficient documentation

## 2023-12-29 DIAGNOSIS — Z9011 Acquired absence of right breast and nipple: Secondary | ICD-10-CM | POA: Insufficient documentation

## 2023-12-29 DIAGNOSIS — E162 Hypoglycemia, unspecified: Secondary | ICD-10-CM | POA: Insufficient documentation

## 2023-12-29 DIAGNOSIS — R232 Flushing: Secondary | ICD-10-CM | POA: Diagnosis not present

## 2023-12-29 LAB — CBC WITH DIFFERENTIAL (CANCER CENTER ONLY)
Abs Immature Granulocytes: 0.01 K/uL (ref 0.00–0.07)
Basophils Absolute: 0 K/uL (ref 0.0–0.1)
Basophils Relative: 0 %
Eosinophils Absolute: 0.1 K/uL (ref 0.0–0.5)
Eosinophils Relative: 1 %
HCT: 37.5 % (ref 36.0–46.0)
Hemoglobin: 12.7 g/dL (ref 12.0–15.0)
Immature Granulocytes: 0 %
Lymphocytes Relative: 41 %
Lymphs Abs: 2.3 K/uL (ref 0.7–4.0)
MCH: 29.5 pg (ref 26.0–34.0)
MCHC: 33.9 g/dL (ref 30.0–36.0)
MCV: 87.2 fL (ref 80.0–100.0)
Monocytes Absolute: 0.3 K/uL (ref 0.1–1.0)
Monocytes Relative: 5 %
Neutro Abs: 2.9 K/uL (ref 1.7–7.7)
Neutrophils Relative %: 53 %
Platelet Count: 150 K/uL (ref 150–400)
RBC: 4.3 MIL/uL (ref 3.87–5.11)
RDW: 11.9 % (ref 11.5–15.5)
WBC Count: 5.6 K/uL (ref 4.0–10.5)
nRBC: 0 % (ref 0.0–0.2)

## 2023-12-29 LAB — CMP (CANCER CENTER ONLY)
ALT: 14 U/L (ref 0–44)
AST: 17 U/L (ref 15–41)
Albumin: 4.1 g/dL (ref 3.5–5.0)
Alkaline Phosphatase: 50 U/L (ref 38–126)
Anion gap: 3 — ABNORMAL LOW (ref 5–15)
BUN: 11 mg/dL (ref 6–20)
CO2: 30 mmol/L (ref 22–32)
Calcium: 9.6 mg/dL (ref 8.9–10.3)
Chloride: 108 mmol/L (ref 98–111)
Creatinine: 0.88 mg/dL (ref 0.44–1.00)
GFR, Estimated: >60 mL/min (ref >60)
Glucose, Bld: 64 mg/dL — ABNORMAL LOW (ref 70–99)
Potassium: 3.7 mmol/L (ref 3.5–5.1)
Sodium: 141 mmol/L (ref 135–145)
Total Bilirubin: 0.4 mg/dL (ref 0.0–1.2)
Total Protein: 7.4 g/dL (ref 6.5–8.1)

## 2024-01-03 ENCOUNTER — Other Ambulatory Visit: Payer: Self-pay | Admitting: Hematology

## 2024-01-03 DIAGNOSIS — C50211 Malignant neoplasm of upper-inner quadrant of right female breast: Secondary | ICD-10-CM

## 2024-01-09 ENCOUNTER — Encounter: Payer: Self-pay | Admitting: Nurse Practitioner

## 2024-01-18 ENCOUNTER — Other Ambulatory Visit: Payer: Self-pay | Admitting: Medical Genetics

## 2024-01-19 ENCOUNTER — Other Ambulatory Visit: Payer: Self-pay | Admitting: Hematology

## 2024-01-19 DIAGNOSIS — Z170 Estrogen receptor positive status [ER+]: Secondary | ICD-10-CM

## 2024-01-25 ENCOUNTER — Ambulatory Visit
Admission: RE | Admit: 2024-01-25 | Discharge: 2024-01-25 | Disposition: A | Source: Ambulatory Visit | Attending: Hematology

## 2024-01-25 ENCOUNTER — Ambulatory Visit
Admission: RE | Admit: 2024-01-25 | Discharge: 2024-01-25 | Disposition: A | Discharge status: Home / Self Care | Source: Ambulatory Visit | Attending: Hematology | Admitting: Hematology

## 2024-01-25 DIAGNOSIS — C50211 Malignant neoplasm of upper-inner quadrant of right female breast: Secondary | ICD-10-CM

## 2024-01-25 DIAGNOSIS — Z170 Estrogen receptor positive status [ER+]: Secondary | ICD-10-CM

## 2024-01-27 ENCOUNTER — Ambulatory Visit

## 2024-02-02 ENCOUNTER — Other Ambulatory Visit: Payer: Self-pay

## 2024-02-02 NOTE — Progress Notes (Signed)
 Patient called in inquiring about being able to take Kerri Metta Bonine Stress supplement with her Tamoxifen . I contacted [facility] [employer] [facility] to see if was ok to take she stated that she should not take this supplement due to it increasing hormones causing an imbalance. Patient fully understood and had no further questions.

## 2024-04-09 ENCOUNTER — Other Ambulatory Visit: Payer: Self-pay | Admitting: Hematology

## 2024-04-12 ENCOUNTER — Other Ambulatory Visit (HOSPITAL_COMMUNITY)
Admission: RE | Admit: 2024-04-12 | Discharge: 2024-04-12 | Disposition: A | Discharge status: Home / Self Care | Payer: Self-pay | Source: Ambulatory Visit | Attending: Medical Genetics | Admitting: Medical Genetics

## 2024-04-23 LAB — GENECONNECT MOLECULAR SCREEN: Genetic Analysis Overall Interpretation: NEGATIVE

## 2024-06-28 ENCOUNTER — Inpatient Hospital Stay

## 2024-06-28 ENCOUNTER — Inpatient Hospital Stay: Admitting: Nurse Practitioner
